# Patient Record
Sex: Male | Born: 1945 | Race: White | Hispanic: No | Marital: Married | Smoking: Never smoker
Health system: Southern US, Community
[De-identification: ages and names within clinical notes are randomized; demographics above are authoritative.]

## PROBLEM LIST (undated history)

## (undated) DIAGNOSIS — F329 Major depressive disorder, single episode, unspecified: Secondary | ICD-10-CM

## (undated) DIAGNOSIS — R0600 Dyspnea, unspecified: Secondary | ICD-10-CM

## (undated) DIAGNOSIS — F32A Depression, unspecified: Secondary | ICD-10-CM

## (undated) DIAGNOSIS — E785 Hyperlipidemia, unspecified: Secondary | ICD-10-CM

## (undated) DIAGNOSIS — M48061 Spinal stenosis, lumbar region without neurogenic claudication: Secondary | ICD-10-CM

## (undated) DIAGNOSIS — N400 Enlarged prostate without lower urinary tract symptoms: Secondary | ICD-10-CM

## (undated) DIAGNOSIS — M1990 Unspecified osteoarthritis, unspecified site: Secondary | ICD-10-CM

## (undated) DIAGNOSIS — S3993XA Unspecified injury of pelvis, initial encounter: Secondary | ICD-10-CM

## (undated) HISTORY — PX: TONSILLECTOMY: SUR1361

## (undated) HISTORY — PX: OTHER SURGICAL HISTORY: SHX169

---

## 2001-04-22 ENCOUNTER — Encounter: Payer: Self-pay | Admitting: Family Medicine

## 2001-04-22 ENCOUNTER — Ambulatory Visit (HOSPITAL_COMMUNITY): Admission: RE | Admit: 2001-04-22 | Discharge: 2001-04-22 | Payer: Self-pay | Admitting: Family Medicine

## 2001-05-06 ENCOUNTER — Encounter (INDEPENDENT_AMBULATORY_CARE_PROVIDER_SITE_OTHER): Payer: Self-pay | Admitting: Specialist

## 2001-05-06 ENCOUNTER — Ambulatory Visit (HOSPITAL_BASED_OUTPATIENT_CLINIC_OR_DEPARTMENT_OTHER): Admission: RE | Admit: 2001-05-06 | Discharge: 2001-05-06 | Payer: Self-pay | Admitting: Plastic Surgery

## 2001-07-15 ENCOUNTER — Ambulatory Visit (HOSPITAL_COMMUNITY): Admission: RE | Admit: 2001-07-15 | Discharge: 2001-07-15 | Payer: Self-pay | Admitting: Gastroenterology

## 2001-07-15 ENCOUNTER — Encounter (INDEPENDENT_AMBULATORY_CARE_PROVIDER_SITE_OTHER): Payer: Self-pay

## 2001-12-26 ENCOUNTER — Encounter: Admission: RE | Admit: 2001-12-26 | Discharge: 2001-12-26 | Payer: Self-pay | Admitting: *Deleted

## 2009-09-21 ENCOUNTER — Encounter: Admission: RE | Admit: 2009-09-21 | Discharge: 2009-09-21 | Payer: Self-pay | Admitting: General Surgery

## 2009-11-09 ENCOUNTER — Inpatient Hospital Stay (HOSPITAL_COMMUNITY): Admission: RE | Admit: 2009-11-09 | Discharge: 2009-11-13 | Payer: Self-pay | Admitting: Orthopedic Surgery

## 2010-11-21 LAB — CBC
HCT: 33 % — ABNORMAL LOW (ref 39.0–52.0)
HCT: 34 % — ABNORMAL LOW (ref 39.0–52.0)
HCT: 38.1 % — ABNORMAL LOW (ref 39.0–52.0)
Hemoglobin: 11.3 g/dL — ABNORMAL LOW (ref 13.0–17.0)
MCHC: 32.9 g/dL (ref 30.0–36.0)
MCV: 91.2 fL (ref 78.0–100.0)
MCV: 91.7 fL (ref 78.0–100.0)
MCV: 92.4 fL (ref 78.0–100.0)
Platelets: 159 10*3/uL (ref 150–400)
Platelets: 189 10*3/uL (ref 150–400)
RBC: 3.65 MIL/uL — ABNORMAL LOW (ref 4.22–5.81)
RBC: 3.73 MIL/uL — ABNORMAL LOW (ref 4.22–5.81)
RBC: 4.15 MIL/uL — ABNORMAL LOW (ref 4.22–5.81)
RBC: 4.21 MIL/uL — ABNORMAL LOW (ref 4.22–5.81)
WBC: 10.3 10*3/uL (ref 4.0–10.5)
WBC: 10.6 10*3/uL — ABNORMAL HIGH (ref 4.0–10.5)

## 2010-11-21 LAB — PROTIME-INR
INR: 1.07 (ref 0.00–1.49)
INR: 1.08 (ref 0.00–1.49)
INR: 2.16 — ABNORMAL HIGH (ref 0.00–1.49)
Prothrombin Time: 13.8 seconds (ref 11.6–15.2)
Prothrombin Time: 13.9 seconds (ref 11.6–15.2)
Prothrombin Time: 22.2 seconds — ABNORMAL HIGH (ref 11.6–15.2)

## 2010-11-21 LAB — BASIC METABOLIC PANEL
BUN: 9 mg/dL (ref 6–23)
CO2: 29 mEq/L (ref 19–32)
Calcium: 8.3 mg/dL — ABNORMAL LOW (ref 8.4–10.5)
Calcium: 8.7 mg/dL (ref 8.4–10.5)
Chloride: 100 mEq/L (ref 96–112)
GFR calc Af Amer: >60 mL/min (ref >60)
GFR calc Af Amer: >60 mL/min (ref >60)
GFR calc Af Amer: >60 mL/min (ref >60)
GFR calc non Af Amer: >60 mL/min (ref >60)
GFR calc non Af Amer: >60 mL/min (ref >60)
GFR calc non Af Amer: >60 mL/min (ref >60)
Glucose, Bld: 111 mg/dL — ABNORMAL HIGH (ref 70–99)
Potassium: 4 mEq/L (ref 3.5–5.1)
Potassium: 4.3 mEq/L (ref 3.5–5.1)
Potassium: 4.6 mEq/L (ref 3.5–5.1)
Sodium: 134 mEq/L — ABNORMAL LOW (ref 135–145)
Sodium: 134 mEq/L — ABNORMAL LOW (ref 135–145)
Sodium: 137 mEq/L (ref 135–145)

## 2010-11-21 LAB — COMPREHENSIVE METABOLIC PANEL
AST: 29 U/L (ref 0–37)
BUN: 13 mg/dL (ref 6–23)
CO2: 28 mEq/L (ref 19–32)
Calcium: 8.3 mg/dL — ABNORMAL LOW (ref 8.4–10.5)
Chloride: 107 mEq/L (ref 96–112)
Creatinine, Ser: 0.75 mg/dL (ref 0.4–1.5)
GFR calc Af Amer: >60 mL/min (ref >60)
GFR calc non Af Amer: >60 mL/min (ref >60)
Total Bilirubin: 1.2 mg/dL (ref 0.3–1.2)

## 2010-11-21 LAB — TYPE AND SCREEN
ABO/RH(D): B POS
Antibody Screen: NEGATIVE

## 2010-11-21 LAB — ABO/RH: ABO/RH(D): B POS

## 2011-01-13 NOTE — Op Note (Signed)
[place]. [facility]  Patient:    [name], [name] Visit Number: [id number] MRN: [id number]          Service Type: DSU Location: [facility] Attending Physician:  Loura Halt Ii Dictated by:   Alfredia Ferguson, M.D. Proc. Date: 05/06/01 Admit Date:  05/06/2001   CC:         Hope M. Danella Deis, M.D.   Operative Report  PREOPERATIVE DIAGNOSES: 1. Biopsy-proven atypical pigmented nevus, left mid-forehead 5.0 mm. 2. A 6.0 mm raised skin lesion, right postauricular area near the root    of the helix.  POSTOPERATIVE DIAGNOSES: 1. Biopsy-proven atypical pigmented nevus, left mid-forehead 5.0 mm. 2. A 6.0 mm raised skin lesion, right postauricular area near the root    of the helix.  OPERATION: 1. Excision of previous biopsy site, left mid-forehead. 2. Excision of 6.0 mm raised skin lesion, right postauricular area.  SURGEON:  Alfredia Ferguson, M.D.  ANESTHESIA:  2% Xylocaine, with 1:100,000 epinephrine.  INDICATIONS:  This is a 65 year old gentleman who had a biopsy of a pigmented nevus in his left forehead.  The lesion returned atypical __________. Margins were positive.  Recommendation for further excision of the biopsy site has been made.  There is no residual pigmentation noted on todays examination.  The patient also points out about a 6.0 mm raised cobblestone textured skin lesion near the root of his right helix.  He wishes to have that excised.  The patient understands he is trading what he has for a permanent potentially unsightly scarring.  In spite of that he wishes to proceed with the surgery.  DESCRIPTION OF PROCEDURE:  The skin marks were placed in an elliptical fashion around both of the two lesions, and then local anesthesia was infiltrated. After waiting for approximately 10 minutes, the area behind his right ear was prepped and draped in a sterile fashion.  An elliptical excision of the lesion down to the level of the subcutaneous  tissue was carried out.  Hemostasis was accomplished using electrocautery.  The wound was closed using interrupted #5-0 nylon sutures.  The patients head was then turned to the midline, and the forehead was prepped and draped in a sterile fashion.  An elliptical excision of the previous biopsy scar was carried out, down to the level of the subcutaneous tissue.  The lesion was passed on for pathology.  The wound edges were undermined for a distance of 2.0 to 3.0 mm in all directions.  The wound was closed by approximating the dermis using interrupted #5-0 Vicryl suture. The skin was united using a running #5-0 nylon suture.  A light dressing was applied to the forehead.  The patient tolerated the procedure well with minimal blood loss.  He was discharged to home in satisfactory condition. Dictated by:   Alfredia Ferguson, M.D. Attending Physician:  Loura Halt Ii DD:  05/06/01 TD:  05/06/01 Job: [id number]

## 2011-01-13 NOTE — Procedures (Signed)
[facility]  Patient:    [name], [name] Visit Number: [id number] MRN: [id number]          Service Type: Attending:  Verlin Grills, M.D. Dictated by:   Verlin Grills, M.D. Proc. Date: 07/15/01   CC:         Amada Jupiter T. Dreiling, M.D.   Procedure Report  PROCEDURE:  Colonoscopy with polypectomy.  REFERRING PHYSICIAN:  Amada Jupiter T. Dreiling, M.D.  PROCEDURE INDICATION:  Mr. [name], (date of birth 30-Dec-1945), is a 65 year old male.  Mr. [name] underwent a colonoscopy five yeras ago and a small neoplastic polyp was removed from his colon.  He is scheduled for repeat surveillance colonoscopy with polypectomy to prevent colon cancer.  ENDOSCOPIST:  Verlin Grills, M.D.  PREMEDICATION:  Versed 5 mg, Demerol 25 mg.  ENDOSCOPE:  Olympus pediatric colonoscope.  DESCRIPTION OF PROCEDURE:  After obtaining informed consent, and discussing the risks of colonoscopy with polypectomy with Mr. [name], including intestinal bleeding and intestinal perforation, Mr. [name] was placed in the left lateral decubitus position.  I administered intravenous Versed and intravenous Demerol to achieve conscious sedation for the procedure.  The patients blood pressure, oxygen saturation, and cardiac rhythm were monitored throughout the procedure and documented in the medical record.  Anal inspection was normal.  Digital rectal exam revealed a slightly enlarged but nonnodular prostate.  The Olympus pediatric video colonoscope was introduced into the rectum and easily advanced to the cecum.  Colonic preparation for the exam today was excellent.  RECTUM:  Normal.  SIGMOID COLON AND DESCENDING COLON:  From the distal sigmoid colon, a 2 mm sessile polyp was removed with the hot biopsy forceps and submitted for pathological interpretation.  SPLENIC FLEXURE:  Normal.  TRANSVERSE COLON:  Normal.  HEPATIC FLEXURE:  Normal.  ASCENDING COLON:   Normal.  CECUM AND ILEOCECAL VALVE:  Normal.  ASSESSMENT:  From the distal sigmoid colon, a 2 mm sessile polyp was removed with the hot biopsy forceps.  Otherwise normal proctocolonoscopy to the cecum.  RECOMMENDATIONS:  Repeat colonoscopy in approximately five years. Dictated by:   Verlin Grills, M.D. Attending:  Verlin Grills, M.D. DD:  07/15/01 TD:  07/15/01 Job: [id number]

## 2011-03-06 ENCOUNTER — Ambulatory Visit (HOSPITAL_BASED_OUTPATIENT_CLINIC_OR_DEPARTMENT_OTHER)
Admission: RE | Admit: 2011-03-06 | Discharge: 2011-03-07 | Disposition: A | Discharge status: Home / Self Care | Payer: PRIVATE HEALTH INSURANCE | Source: Ambulatory Visit | Attending: Orthopedic Surgery | Admitting: Orthopedic Surgery

## 2011-03-06 DIAGNOSIS — Z01810 Encounter for preprocedural cardiovascular examination: Secondary | ICD-10-CM | POA: Insufficient documentation

## 2011-03-06 DIAGNOSIS — M719 Bursopathy, unspecified: Secondary | ICD-10-CM | POA: Insufficient documentation

## 2011-03-06 DIAGNOSIS — Z01812 Encounter for preprocedural laboratory examination: Secondary | ICD-10-CM | POA: Insufficient documentation

## 2011-03-06 DIAGNOSIS — M67919 Unspecified disorder of synovium and tendon, unspecified shoulder: Secondary | ICD-10-CM | POA: Insufficient documentation

## 2011-03-06 DIAGNOSIS — I10 Essential (primary) hypertension: Secondary | ICD-10-CM | POA: Insufficient documentation

## 2011-03-06 DIAGNOSIS — M66329 Spontaneous rupture of flexor tendons, unspecified upper arm: Secondary | ICD-10-CM | POA: Insufficient documentation

## 2011-03-06 DIAGNOSIS — M19019 Primary osteoarthritis, unspecified shoulder: Secondary | ICD-10-CM | POA: Insufficient documentation

## 2011-03-06 DIAGNOSIS — M24119 Other articular cartilage disorders, unspecified shoulder: Secondary | ICD-10-CM | POA: Insufficient documentation

## 2011-03-06 DIAGNOSIS — M25519 Pain in unspecified shoulder: Secondary | ICD-10-CM | POA: Insufficient documentation

## 2011-03-06 LAB — POCT HEMOGLOBIN-HEMACUE: Hemoglobin: 15.9 g/dL (ref 13.0–17.0)

## 2011-03-09 NOTE — Op Note (Signed)
NAMEMAC, DOWDELL NO.:  [id number]  MEDICAL RECORD NO.:  [id number]  LOCATION:                                 FACILITY:  PHYSICIAN:  Marlowe Kays, M.D.       DATE OF BIRTH:  DATE OF PROCEDURE:  03/06/2011 DATE OF DISCHARGE:                              OPERATIVE REPORT   PREOPERATIVE DIAGNOSES: 1. Labral tear. 2. Partial tear of long head biceps tendon. 3. 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  POSTOP DIAGNOSES: 1. Labral and biceps tendon tear. 2. 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 3. Acromioclavicular joint arthritis, right shoulder.  OPERATION: 1. Right shoulder arthroscopy with debridement of labrum and remnant     of long head biceps tendon. 2. Arthroscopic subacromial decompression. 3. Open distal clavicle resection. 4. Open repair of complete retracted rotator cuff tear.  SURGEON:  Marlowe Kays, M.D.  ASSISTANTDruscilla Brownie. Idolina Primer, PA-C  ANESTHESIA:  General.  PLAN/JUSTIFICATION FOR PROCEDURE:  Because of the right shoulder pain back on September 03, 2009 or year-and-a-half ago, he had right shoulder MRI demonstrating the preoperative diagnoses.  He waited until the pain became enough of a problem to have the above-mentioned surgery performed.  PROCEDURE:  Prophylactic antibiotics, interscalene block by anesthesiologist, satisfied general anesthesia, beach-chair position on the sliding frame, right shoulder girdle was prepped with DuraPrep, draped in sterile field.  Time-out performed.  Anatomy of the shoulder joint was marked out.  Through a posterior soft portal, I dramatically entered the keel glenohumeral joint.  There was a large amount of synovitis present.  A long head of the biceps tendon was not clearly definable.  He did have degenerative type tearing of the labrum to either side of the  biceps anchor.  I advanced the scope between what appeared to be the remnant of biceps tendon and the subscapularis, and using switching stick made an anterior incision over which I placed a metal cannula, followed by 4.2 shaver entering into the joint, cleared up the synovitis to shave the labrum and also the biceps tendon.  I then redirected the scope into the subacromial space, as expected there was good bit of bursitis present.  Through a lateral portal, I entered the 4.2 shaver and cleaned up most of bursitis and then began the subacromial decompression.  I worked first with a Forensic scientist removing soft tissue from the undersurface of the acromion and followed this with a 4-mm oval bur and worked back-and-forth between these 2 instruments.  When we had a nice decompression, I then probed the rotator cuff and found a large retracted full-thickness rotator cuff tear.  Accordingly, I had already planned to open up the distal clavicle and performed distal clavicle excision and I simply extended the incision more lateralward.  At the [facility] joint with subperiosteal dissection, I identified the [facility] joint and measured a centimeter and a half proximal ward undermining the clavicle.  At this point, I used a microsaw to amputate the clavicle and then removed the lateral fragment with towel clip and cautery technique.  I removed some small remaining bone spicules on the parent clavicle which I covered with bone wax.  I then extended the incision distal ward and with subperiosteal dissection, I identified the anterior acromion and confirmed the large rotator cuff tear.  I took pictures of it.  I had removed small amount of additional acromion for exposure, although the decompression from the arthroscopy portion was satisfactory.  I elected to use 2 striker four strand rotator cuff anchors, weaving them throughout the length of the tear.  He had a substantial remnant of lateral  rotator cuff remaining. With each needle, I wove the suture from underneath up through the rotator cuff and back down through the lateral tissue.  I tied them all as a unit and then at some additional sutures individually at the suture line.  This seemed to give a nice stable repair.  Final pictures were taken.  Wound was irrigated with sterile saline.  Gelfoam was placed in the distal clavicle resection site.  The fascia over the residual acromion and clavicle was closed with interrupted #1 Vicryl, subcutaneous tissue with 2-0 Vicryl.  Steri-Strips on the skin.  Three portals were closed with 4-0 nylon, Betadine and Adaptic dry.  Dry sterile dressing were applied followed by shoulder immobilizer.  He tolerated the procedure well, was taken to recovery room in satisfactory condition with no known complications.          ______________________________ Marlowe Kays, M.D.     JA/MEDQ  D:  03/06/2011  T:  03/06/2011  Job:  [id number]  Electronically Signed by Marlowe Kays M.D. on 03/09/2011 12:19:21 PM

## 2011-05-19 ENCOUNTER — Other Ambulatory Visit: Payer: Self-pay | Admitting: Orthopedic Surgery

## 2011-05-19 DIAGNOSIS — M25519 Pain in unspecified shoulder: Secondary | ICD-10-CM

## 2011-06-07 ENCOUNTER — Ambulatory Visit
Admission: RE | Admit: 2011-06-07 | Discharge: 2011-06-07 | Disposition: A | Discharge status: Home / Self Care | Payer: PRIVATE HEALTH INSURANCE | Source: Ambulatory Visit | Attending: Orthopedic Surgery | Admitting: Orthopedic Surgery

## 2011-06-07 DIAGNOSIS — M25519 Pain in unspecified shoulder: Secondary | ICD-10-CM

## 2011-06-07 MED ORDER — IOHEXOL 180 MG/ML  SOLN: 5.0000 mL | Freq: Once | INTRAMUSCULAR | Status: AC | PRN

## 2011-06-08 ENCOUNTER — Emergency Department (HOSPITAL_COMMUNITY): Payer: Medicare Other

## 2011-06-08 ENCOUNTER — Emergency Department (HOSPITAL_COMMUNITY)
Admission: EM | Admit: 2011-06-08 | Discharge: 2011-06-09 | Disposition: A | Discharge status: Other Acute Inpatient Hospital | Payer: Medicare Other | Source: Home / Self Care | Attending: Emergency Medicine | Admitting: Emergency Medicine

## 2011-06-08 DIAGNOSIS — W309XXA Contact with unspecified agricultural machinery, initial encounter: Secondary | ICD-10-CM | POA: Insufficient documentation

## 2011-06-08 DIAGNOSIS — R109 Unspecified abdominal pain: Secondary | ICD-10-CM | POA: Insufficient documentation

## 2011-06-08 DIAGNOSIS — M25559 Pain in unspecified hip: Secondary | ICD-10-CM | POA: Insufficient documentation

## 2011-06-08 DIAGNOSIS — S329XXA Fracture of unspecified parts of lumbosacral spine and pelvis, initial encounter for closed fracture: Secondary | ICD-10-CM

## 2011-06-08 DIAGNOSIS — Y92009 Unspecified place in unspecified non-institutional (private) residence as the place of occurrence of the external cause: Secondary | ICD-10-CM | POA: Insufficient documentation

## 2011-06-08 DIAGNOSIS — Z96649 Presence of unspecified artificial hip joint: Secondary | ICD-10-CM | POA: Insufficient documentation

## 2011-06-08 DIAGNOSIS — S32309A Unspecified fracture of unspecified ilium, initial encounter for closed fracture: Secondary | ICD-10-CM

## 2011-06-08 DIAGNOSIS — S32409A Unspecified fracture of unspecified acetabulum, initial encounter for closed fracture: Secondary | ICD-10-CM

## 2011-06-08 DIAGNOSIS — S7700XA Crushing injury of unspecified hip, initial encounter: Secondary | ICD-10-CM | POA: Insufficient documentation

## 2011-06-08 LAB — PROTIME-INR: INR: 1.08 (ref 0.00–1.49)

## 2011-06-08 LAB — DIFFERENTIAL
Basophils Absolute: 0 10*3/uL (ref 0.0–0.1)
Eosinophils Absolute: 0.3 10*3/uL (ref 0.0–0.7)
Lymphocytes Relative: 8 % — ABNORMAL LOW (ref 12–46)
Monocytes Absolute: 1.6 10*3/uL — ABNORMAL HIGH (ref 0.1–1.0)
Neutrophils Relative %: 85 % — ABNORMAL HIGH (ref 43–77)

## 2011-06-08 LAB — COMPREHENSIVE METABOLIC PANEL
ALT: 42 U/L (ref 0–53)
AST: 47 U/L — ABNORMAL HIGH (ref 0–37)
Albumin: 3.8 g/dL (ref 3.5–5.2)
Alkaline Phosphatase: 93 U/L (ref 39–117)
Chloride: 102 mEq/L (ref 96–112)
Creatinine, Ser: 1.05 mg/dL (ref 0.50–1.35)
Potassium: 3.8 mEq/L (ref 3.5–5.1)
Sodium: 139 mEq/L (ref 135–145)
Total Bilirubin: 0.6 mg/dL (ref 0.3–1.2)

## 2011-06-08 LAB — CBC
Platelets: 228 10*3/uL (ref 150–400)
RDW: 13.2 % (ref 11.5–15.5)
WBC: 26.2 10*3/uL — ABNORMAL HIGH (ref 4.0–10.5)

## 2011-06-08 LAB — SAMPLE TO BLOOD BANK

## 2011-06-08 MED ORDER — IOHEXOL 300 MG/ML  SOLN
100.0000 mL | Freq: Once | INTRAMUSCULAR | Status: AC | PRN
  Administered 2011-06-08: 100 mL via INTRAVENOUS

## 2011-06-09 ENCOUNTER — Inpatient Hospital Stay (HOSPITAL_COMMUNITY)
Admission: EM | Admit: 2011-06-09 | Discharge: 2011-06-19 | DRG: 913 | Disposition: A | Discharge status: Skilled Nursing Facility | Payer: Medicare Other | Source: Other Acute Inpatient Hospital | Attending: General Surgery | Admitting: General Surgery

## 2011-06-09 DIAGNOSIS — K560 Paralytic ileus: Secondary | ICD-10-CM | POA: Diagnosis not present

## 2011-06-09 DIAGNOSIS — F3289 Other specified depressive episodes: Secondary | ICD-10-CM | POA: Diagnosis present

## 2011-06-09 DIAGNOSIS — S32509A Unspecified fracture of unspecified pubis, initial encounter for closed fracture: Secondary | ICD-10-CM | POA: Diagnosis present

## 2011-06-09 DIAGNOSIS — W230XXA Caught, crushed, jammed, or pinched between moving objects, initial encounter: Secondary | ICD-10-CM | POA: Diagnosis present

## 2011-06-09 DIAGNOSIS — S32409A Unspecified fracture of unspecified acetabulum, initial encounter for closed fracture: Secondary | ICD-10-CM | POA: Diagnosis present

## 2011-06-09 DIAGNOSIS — F329 Major depressive disorder, single episode, unspecified: Secondary | ICD-10-CM | POA: Diagnosis present

## 2011-06-09 DIAGNOSIS — S3210XA Unspecified fracture of sacrum, initial encounter for closed fracture: Secondary | ICD-10-CM | POA: Diagnosis present

## 2011-06-09 DIAGNOSIS — D62 Acute posthemorrhagic anemia: Secondary | ICD-10-CM

## 2011-06-09 DIAGNOSIS — IMO0001 Reserved for inherently not codable concepts without codable children: Principal | ICD-10-CM | POA: Diagnosis present

## 2011-06-09 DIAGNOSIS — S32609A Unspecified fracture of unspecified ischium, initial encounter for closed fracture: Secondary | ICD-10-CM | POA: Diagnosis present

## 2011-06-09 DIAGNOSIS — IMO0002 Reserved for concepts with insufficient information to code with codable children: Secondary | ICD-10-CM | POA: Diagnosis present

## 2011-06-09 DIAGNOSIS — R339 Retention of urine, unspecified: Secondary | ICD-10-CM | POA: Diagnosis present

## 2011-06-09 DIAGNOSIS — S9000XA Contusion of unspecified ankle, initial encounter: Secondary | ICD-10-CM | POA: Diagnosis present

## 2011-06-09 LAB — CBC
Hemoglobin: 12.3 g/dL — ABNORMAL LOW (ref 13.0–17.0)
RBC: 3.97 MIL/uL — ABNORMAL LOW (ref 4.22–5.81)
WBC: 16.9 10*3/uL — ABNORMAL HIGH (ref 4.0–10.5)

## 2011-06-09 LAB — BASIC METABOLIC PANEL
CO2: 25 mEq/L (ref 19–32)
Chloride: 108 mEq/L (ref 96–112)
Glucose, Bld: 143 mg/dL — ABNORMAL HIGH (ref 70–99)
Potassium: 4.4 mEq/L (ref 3.5–5.1)
Sodium: 141 mEq/L (ref 135–145)

## 2011-06-10 ENCOUNTER — Inpatient Hospital Stay (HOSPITAL_COMMUNITY): Payer: Medicare Other

## 2011-06-10 LAB — CBC
HCT: 33.6 % — ABNORMAL LOW (ref 39.0–52.0)
MCV: 89.6 fL (ref 78.0–100.0)
Platelets: 129 10*3/uL — ABNORMAL LOW (ref 150–400)
RBC: 3.75 MIL/uL — ABNORMAL LOW (ref 4.22–5.81)
WBC: 11.9 10*3/uL — ABNORMAL HIGH (ref 4.0–10.5)

## 2011-06-11 ENCOUNTER — Inpatient Hospital Stay (HOSPITAL_COMMUNITY): Payer: Medicare Other

## 2011-06-11 DIAGNOSIS — K560 Paralytic ileus: Secondary | ICD-10-CM

## 2011-06-11 LAB — CBC
HCT: 30.6 % — ABNORMAL LOW (ref 39.0–52.0)
Hemoglobin: 10.5 g/dL — ABNORMAL LOW (ref 13.0–17.0)
MCH: 30.9 pg (ref 26.0–34.0)
MCHC: 34.3 g/dL (ref 30.0–36.0)
RBC: 3.4 MIL/uL — ABNORMAL LOW (ref 4.22–5.81)

## 2011-06-11 LAB — BASIC METABOLIC PANEL
BUN: 11 mg/dL (ref 6–23)
CO2: 29 mEq/L (ref 19–32)
GFR calc non Af Amer: >90 mL/min (ref >90)
Glucose, Bld: 104 mg/dL — ABNORMAL HIGH (ref 70–99)
Potassium: 3.8 mEq/L (ref 3.5–5.1)

## 2011-06-12 LAB — BASIC METABOLIC PANEL
BUN: 9 mg/dL (ref 6–23)
CO2: 31 mEq/L (ref 19–32)
Calcium: 8.6 mg/dL (ref 8.4–10.5)
Creatinine, Ser: 0.67 mg/dL (ref 0.50–1.35)
GFR calc non Af Amer: >90 mL/min (ref >90)
Glucose, Bld: 99 mg/dL (ref 70–99)
Sodium: 134 mEq/L — ABNORMAL LOW (ref 135–145)

## 2011-06-12 LAB — CBC
Hemoglobin: 9.8 g/dL — ABNORMAL LOW (ref 13.0–17.0)
MCH: 30.4 pg (ref 26.0–34.0)
MCHC: 34.1 g/dL (ref 30.0–36.0)
MCV: 89.1 fL (ref 78.0–100.0)

## 2011-06-12 LAB — GLUCOSE, CAPILLARY: Glucose-Capillary: 99 mg/dL (ref 70–99)

## 2011-06-13 ENCOUNTER — Inpatient Hospital Stay (HOSPITAL_COMMUNITY): Payer: Medicare Other

## 2011-06-13 LAB — CBC
HCT: 28 % — ABNORMAL LOW (ref 39.0–52.0)
Hemoglobin: 9.8 g/dL — ABNORMAL LOW (ref 13.0–17.0)
MCH: 31.2 pg (ref 26.0–34.0)
MCHC: 35 g/dL (ref 30.0–36.0)
MCV: 89.2 fL (ref 78.0–100.0)
RDW: 13.3 % (ref 11.5–15.5)

## 2011-06-13 LAB — URINALYSIS, ROUTINE W REFLEX MICROSCOPIC
Bilirubin Urine: NEGATIVE
Glucose, UA: NEGATIVE mg/dL
Ketones, ur: 15 mg/dL — AB
Nitrite: NEGATIVE
Specific Gravity, Urine: 1.011 (ref 1.005–1.030)
pH: 6 (ref 5.0–8.0)

## 2011-06-13 LAB — URINE MICROSCOPIC-ADD ON

## 2011-06-14 ENCOUNTER — Inpatient Hospital Stay (HOSPITAL_COMMUNITY): Payer: Medicare Other

## 2011-06-14 LAB — BASIC METABOLIC PANEL
CO2: 32 mEq/L (ref 19–32)
Calcium: 9 mg/dL (ref 8.4–10.5)
GFR calc non Af Amer: >90 mL/min (ref >90)
Glucose, Bld: 114 mg/dL — ABNORMAL HIGH (ref 70–99)
Potassium: 3.5 mEq/L (ref 3.5–5.1)
Sodium: 136 mEq/L (ref 135–145)

## 2011-06-14 LAB — URINE CULTURE: Culture: NO GROWTH

## 2011-06-14 LAB — GLUCOSE, CAPILLARY
Glucose-Capillary: 107 mg/dL — ABNORMAL HIGH (ref 70–99)
Glucose-Capillary: 127 mg/dL — ABNORMAL HIGH (ref 70–99)

## 2011-06-14 MED ORDER — IOHEXOL 300 MG/ML  SOLN
80.0000 mL | Freq: Once | INTRAMUSCULAR | Status: AC | PRN
  Administered 2011-06-14: 80 mL via INTRAVENOUS
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
## 2011-06-15 LAB — COMPREHENSIVE METABOLIC PANEL
ALT: 44 U/L (ref 0–53)
AST: 42 U/L — ABNORMAL HIGH (ref 0–37)
CO2: 30 mEq/L (ref 19–32)
Chloride: 99 mEq/L (ref 96–112)
Creatinine, Ser: 0.65 mg/dL (ref 0.50–1.35)
GFR calc non Af Amer: >90 mL/min (ref >90)
Total Bilirubin: 1.3 mg/dL — ABNORMAL HIGH (ref 0.3–1.2)

## 2011-06-15 LAB — BASIC METABOLIC PANEL
BUN: 9 mg/dL (ref 6–23)
Creatinine, Ser: 0.65 mg/dL (ref 0.50–1.35)
GFR calc Af Amer: >90 mL/min (ref >90)
GFR calc non Af Amer: >90 mL/min (ref >90)
Glucose, Bld: 123 mg/dL — ABNORMAL HIGH (ref 70–99)

## 2011-06-15 LAB — CBC
HCT: 29.2 % — ABNORMAL LOW (ref 39.0–52.0)
Hemoglobin: 10.1 g/dL — ABNORMAL LOW (ref 13.0–17.0)
MCH: 30.4 pg (ref 26.0–34.0)
MCV: 89.6 fL (ref 78.0–100.0)
MCV: 88.7 fL (ref 78.0–100.0)
Platelets: 216 10*3/uL (ref 150–400)
Platelets: 227 10*3/uL (ref 150–400)
RBC: 3.28 MIL/uL — ABNORMAL LOW (ref 4.22–5.81)
RDW: 13.7 % (ref 11.5–15.5)

## 2011-06-15 LAB — GLUCOSE, CAPILLARY

## 2011-06-15 LAB — PREALBUMIN: Prealbumin: 12.8 mg/dL — ABNORMAL LOW (ref 17.0–34.0)

## 2011-06-16 LAB — GLUCOSE, CAPILLARY: Glucose-Capillary: 106 mg/dL — ABNORMAL HIGH (ref 70–99)

## 2011-06-16 LAB — BASIC METABOLIC PANEL
CO2: 29 mEq/L (ref 19–32)
Chloride: 99 mEq/L (ref 96–112)
Sodium: 135 mEq/L (ref 135–145)

## 2011-06-16 LAB — CHOLESTEROL, TOTAL: Cholesterol: 172 mg/dL (ref 0–200)

## 2011-06-16 LAB — MAGNESIUM: Magnesium: 2.2 mg/dL (ref 1.5–2.5)

## 2011-06-19 ENCOUNTER — Inpatient Hospital Stay
Admission: RE | Admit: 2011-06-19 | Discharge: 2011-07-07 | Disposition: A | Discharge status: Home / Self Care | Payer: Medicare Other | Source: Ambulatory Visit | Attending: Internal Medicine | Admitting: Internal Medicine

## 2011-06-19 LAB — DIFFERENTIAL
Eosinophils Relative: 5 % (ref 0–5)
Lymphocytes Relative: 20 % (ref 12–46)
Lymphs Abs: 2.5 10*3/uL (ref 0.7–4.0)
Monocytes Absolute: 1.3 10*3/uL — ABNORMAL HIGH (ref 0.1–1.0)
Monocytes Relative: 11 % (ref 3–12)

## 2011-06-19 LAB — COMPREHENSIVE METABOLIC PANEL
Alkaline Phosphatase: 153 U/L — ABNORMAL HIGH (ref 39–117)
BUN: 18 mg/dL (ref 6–23)
Calcium: 9.4 mg/dL (ref 8.4–10.5)
Creatinine, Ser: 0.65 mg/dL (ref 0.50–1.35)
GFR calc Af Amer: >90 mL/min (ref >90)
Glucose, Bld: 115 mg/dL — ABNORMAL HIGH (ref 70–99)
Potassium: 3.9 mEq/L (ref 3.5–5.1)
Total Protein: 6.7 g/dL (ref 6.0–8.3)

## 2011-06-19 LAB — CBC
HCT: 32.7 % — ABNORMAL LOW (ref 39.0–52.0)
MCH: 31 pg (ref 26.0–34.0)
MCHC: 33.9 g/dL (ref 30.0–36.0)
MCV: 91.3 fL (ref 78.0–100.0)
RDW: 14.3 % (ref 11.5–15.5)

## 2011-06-19 LAB — MAGNESIUM: Magnesium: 2.3 mg/dL (ref 1.5–2.5)
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
## 2012-06-05 ENCOUNTER — Other Ambulatory Visit: Payer: Self-pay | Admitting: Gastroenterology

## 2012-07-24 ENCOUNTER — Ambulatory Visit (INDEPENDENT_AMBULATORY_CARE_PROVIDER_SITE_OTHER): Payer: Medicare Other | Admitting: Family Medicine

## 2012-07-24 VITALS — BP 132/84 | HR 65 | Temp 98.2°F | Resp 16 | Ht 69.0 in | Wt 221.0 lb

## 2012-07-24 DIAGNOSIS — H547 Unspecified visual loss: Secondary | ICD-10-CM

## 2012-07-24 DIAGNOSIS — J45909 Unspecified asthma, uncomplicated: Secondary | ICD-10-CM

## 2012-07-24 DIAGNOSIS — Z0000 Encounter for general adult medical examination without abnormal findings: Secondary | ICD-10-CM

## 2012-07-24 DIAGNOSIS — H9190 Unspecified hearing loss, unspecified ear: Secondary | ICD-10-CM

## 2012-07-24 NOTE — Addendum Note (Signed)
Addended by: Maryann Alar on: 07/24/2012 05:46 PM   Modules accepted: Level of Service

## 2012-07-24 NOTE — Progress Notes (Signed)
This 66 year old gentleman comes in for a DOT physical. He has difficulty hearing out of his right ear and his vision is 20/50 in each eye. He's known for a while that his vision has not very good but hasn't seen a doctor in a while.  Objective: Mildly obese HEENT: Grossly normal including otoscopic and ophthalmologic exams, teeth are in terrible condition Neck: No bruit, no thyromegaly, supple, no adenopathy Chest: Clear Heart: Regular no murmur Abdomen: Lipoma right upper quadrant, no HSM, no masses, no tenderness or guarding Genitalia normal Extremities: Normal with exception of an abrasion on his right  Assessment has not passed ophthalmologic exam for DOT physical. Patient will bring back his exam results from Dr. Dione Booze once he's been evaluated there.

## 2012-08-22 ENCOUNTER — Telehealth: Payer: Self-pay

## 2012-08-22 NOTE — Telephone Encounter (Signed)
Patient went to eye doctor to get info to get DOT PE completed. Would like to know if we have ppw and can get dot ppw done. [phone]

## 2012-08-22 NOTE — Telephone Encounter (Signed)
Have you gotten the information from Dr Dione Booze? I have not.

## 2012-08-22 NOTE — Telephone Encounter (Signed)
Have not seen paper work?

## 2012-08-23 NOTE — Telephone Encounter (Signed)
No paperwork found from dr. Lucious Groves office yet

## 2012-08-23 NOTE — Telephone Encounter (Signed)
Left message for patient to advise I do not have his paperwork, he is instructed to fax it to me or bring it to me here.

## 2012-08-23 NOTE — Telephone Encounter (Signed)
Can you please check to see if you have any paperwork from dr groats office on this pt.

## 2012-08-26 ENCOUNTER — Telehealth: Payer: Self-pay

## 2012-08-26 NOTE — Telephone Encounter (Signed)
Pt states he is returning our call from earlier today   Best number [phone]

## 2012-08-26 NOTE — Telephone Encounter (Signed)
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 Pt stated he will be in to get it.

## 2012-08-27 NOTE — Telephone Encounter (Signed)
See notes under 08/26/12 phone message.

## 2012-09-30 IMAGING — CR DG CHEST 1V PORT
1 series · 1 of 1 positions shown · non-contrast
Comparison: Portable exam 9691 hours compared to 09/03/2006

CLINICAL DATA: PICC line placement

PORTABLE CHEST - 1 VIEW

[view not recorded]
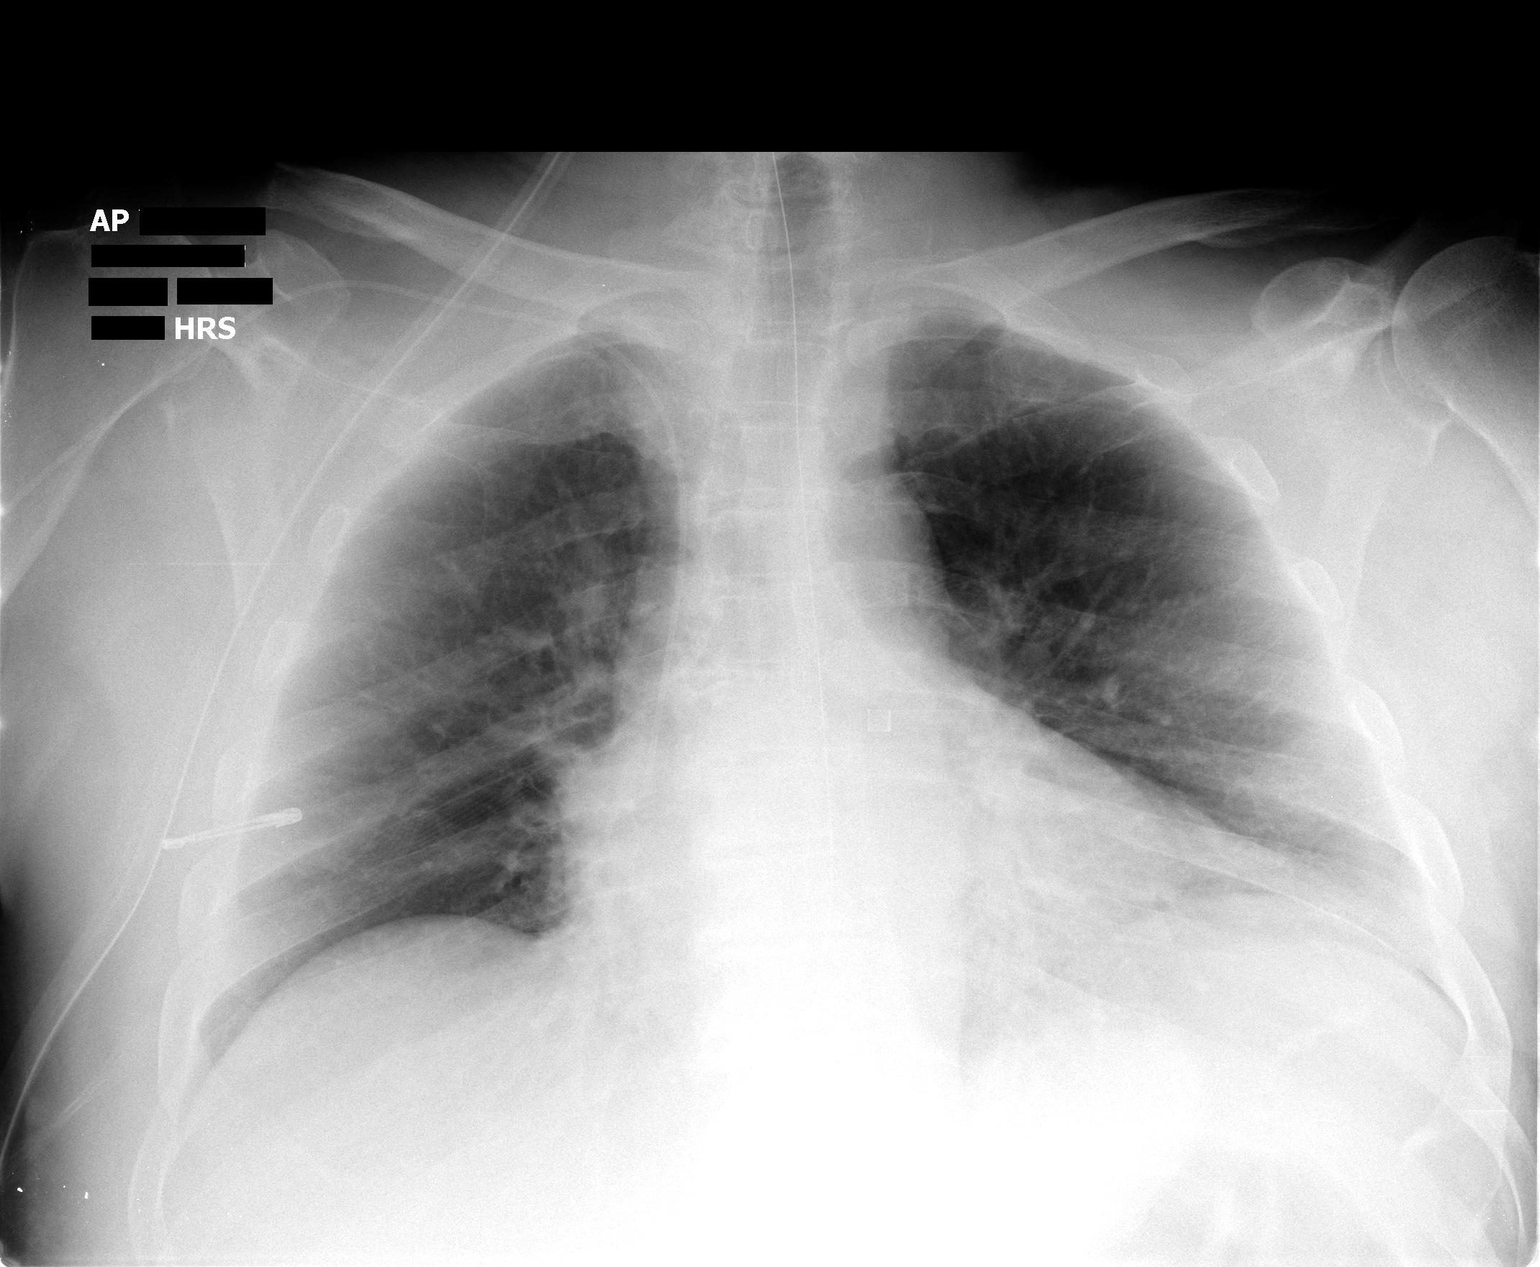

[1 of 1 positions shown; findings below may reference images not displayed]

FINDINGS: Right arm PICC line, tip at cavoatrial junction.
Nasogastric tube coiled in stomach.
Minimal enlargement of cardiac silhouette.
Slight pulmonary vascular congestion.
Minimal bronchitic changes and basilar atelectasis.
Upper lungs clear.
Bones demineralized.
IMPRESSION: Tip of right arm PICC line at cavoatrial junction.
Minimal bibasilar atelectasis and bronchitic changes.

## 2012-11-19 ENCOUNTER — Ambulatory Visit (HOSPITAL_COMMUNITY)
Admission: RE | Admit: 2012-11-19 | Discharge: 2012-11-19 | Disposition: A | Discharge status: Home / Self Care | Payer: Medicare Other | Source: Ambulatory Visit | Attending: Orthopedic Surgery | Admitting: Orthopedic Surgery

## 2012-11-19 ENCOUNTER — Other Ambulatory Visit (HOSPITAL_COMMUNITY): Payer: Self-pay | Admitting: Orthopedic Surgery

## 2012-11-19 DIAGNOSIS — M25512 Pain in left shoulder: Secondary | ICD-10-CM

## 2012-11-19 DIAGNOSIS — Z1389 Encounter for screening for other disorder: Secondary | ICD-10-CM | POA: Insufficient documentation

## 2012-12-03 ENCOUNTER — Other Ambulatory Visit: Payer: Self-pay | Admitting: Orthopedic Surgery

## 2012-12-18 ENCOUNTER — Encounter (HOSPITAL_COMMUNITY): Payer: Self-pay | Admitting: Pharmacy Technician

## 2012-12-23 ENCOUNTER — Encounter (HOSPITAL_COMMUNITY): Payer: Self-pay

## 2012-12-23 ENCOUNTER — Encounter (HOSPITAL_COMMUNITY)
Admission: RE | Admit: 2012-12-23 | Discharge: 2012-12-23 | Disposition: A | Discharge status: Home / Self Care | Payer: Medicare Other | Source: Ambulatory Visit | Attending: Orthopedic Surgery | Admitting: Orthopedic Surgery

## 2012-12-23 DIAGNOSIS — S43429A Sprain of unspecified rotator cuff capsule, initial encounter: Secondary | ICD-10-CM | POA: Insufficient documentation

## 2012-12-23 DIAGNOSIS — X58XXXA Exposure to other specified factors, initial encounter: Secondary | ICD-10-CM | POA: Insufficient documentation

## 2012-12-23 DIAGNOSIS — Z01812 Encounter for preprocedural laboratory examination: Secondary | ICD-10-CM | POA: Insufficient documentation

## 2012-12-23 HISTORY — DX: Depression, unspecified: F32.A

## 2012-12-23 HISTORY — DX: Major depressive disorder, single episode, unspecified: F32.9

## 2012-12-23 HISTORY — PX: SHOULDER ARTHROSCOPY WITH ROTATOR CUFF REPAIR AND SUBACROMIAL DECOMPRESSION: SHX5686

## 2012-12-23 HISTORY — DX: Unspecified osteoarthritis, unspecified site: M19.90

## 2012-12-23 HISTORY — PX: JOINT REPLACEMENT: SHX530

## 2012-12-23 LAB — CBC
HCT: 43.6 % (ref 39.0–52.0)
MCHC: 34.9 g/dL (ref 30.0–36.0)
RDW: 12.9 % (ref 11.5–15.5)

## 2012-12-23 LAB — SURGICAL PCR SCREEN: Staphylococcus aureus: NEGATIVE

## 2012-12-23 NOTE — Patient Instructions (Addendum)
20 [name]  12/23/2012   Your procedure is scheduled on:   01-01-2013  Report to [facility] at   0930     AM .  Call this number if you have problems the morning of surgery: [phone]  Or Presurgical Testing [phone](Gianny Killman)     Do not eat food:After Midnight.    Take these medicines the morning of surgery with A SIP OF WATER: Wellbutrin. Pravastatin.   Do not wear jewelry, make-up or nail polish.  Do not wear lotions, powders, or perfumes. You may wear deodorant.  Do not shave 12 hours prior to first CHG shower(legs and under arms).(face and neck okay.)  Do not bring valuables to the hospital.  Contacts, dentures or bridgework,body piercing,  may not be worn into surgery.  Leave suitcase in the car. After surgery it may be brought to your room.  For patients admitted to the hospital, checkout time is 11:00 AM the day of discharge.   Patients discharged the day of surgery will not be allowed to drive home. Must have responsible person with you x 24 hours once discharged.  Name and phone number of your driver: Jovontae Banko, spouse [phone] cell  Special Instructions: CHG(Chlorhedine 4%-"Hibiclens","Betasept","Aplicare") Shower Use Special Wash: see special instructions.(avoid face and genitals)   Please read over the following fact sheets that you were given: MRSA Information,.    Failure to follow these instructions may result in Cancellation of your surgery.   Patient signature_______________________________________________________

## 2013-01-01 ENCOUNTER — Observation Stay (HOSPITAL_COMMUNITY)
Admission: RE | Admit: 2013-01-01 | Discharge: 2013-01-02 | Disposition: A | Discharge status: Home / Self Care | Payer: Medicare Other | Source: Ambulatory Visit | Attending: Orthopedic Surgery | Admitting: Orthopedic Surgery

## 2013-01-01 ENCOUNTER — Encounter (HOSPITAL_COMMUNITY)
Admission: RE | Disposition: A | Discharge status: Home / Self Care | Payer: Self-pay | Source: Ambulatory Visit | Attending: Orthopedic Surgery

## 2013-01-01 ENCOUNTER — Encounter (HOSPITAL_COMMUNITY): Payer: Self-pay | Admitting: Anesthesiology

## 2013-01-01 ENCOUNTER — Ambulatory Visit (HOSPITAL_COMMUNITY): Payer: Medicare Other | Admitting: Anesthesiology

## 2013-01-01 ENCOUNTER — Encounter (HOSPITAL_COMMUNITY): Payer: Self-pay | Admitting: *Deleted

## 2013-01-01 DIAGNOSIS — X58XXXA Exposure to other specified factors, initial encounter: Secondary | ICD-10-CM | POA: Insufficient documentation

## 2013-01-01 DIAGNOSIS — Z79899 Other long term (current) drug therapy: Secondary | ICD-10-CM | POA: Insufficient documentation

## 2013-01-01 DIAGNOSIS — M25519 Pain in unspecified shoulder: Secondary | ICD-10-CM | POA: Insufficient documentation

## 2013-01-01 DIAGNOSIS — S43422S Sprain of left rotator cuff capsule, sequela: Secondary | ICD-10-CM

## 2013-01-01 DIAGNOSIS — S43429A Sprain of unspecified rotator cuff capsule, initial encounter: Principal | ICD-10-CM | POA: Insufficient documentation

## 2013-01-01 DIAGNOSIS — Z96649 Presence of unspecified artificial hip joint: Secondary | ICD-10-CM | POA: Insufficient documentation

## 2013-01-01 DIAGNOSIS — M47817 Spondylosis without myelopathy or radiculopathy, lumbosacral region: Secondary | ICD-10-CM | POA: Insufficient documentation

## 2013-01-01 HISTORY — PX: SHOULDER OPEN ROTATOR CUFF REPAIR: SHX2407

## 2013-01-01 SURGERY — REPAIR, ROTATOR CUFF, OPEN
Anesthesia: General | Site: Shoulder | Laterality: Left | Wound class: Clean

## 2013-01-01 MED ORDER — METHOCARBAMOL 100 MG/ML IJ SOLN: 500.0000 mg | Freq: Four times a day (QID) | INTRAVENOUS | Status: DC | PRN

## 2013-01-01 MED ORDER — ROPIVACAINE HCL 5 MG/ML IJ SOLN
INTRAMUSCULAR | Status: AC
  Filled 2013-01-01: qty 30

## 2013-01-01 MED ORDER — MELOXICAM 15 MG PO TABS
15.0000 mg | ORAL_TABLET | Freq: Every day | ORAL | Status: DC
  Administered 2013-01-01 – 2013-01-02 (×2): 15 mg via ORAL
  Filled 2013-01-01 (×2): qty 1

## 2013-01-01 MED ORDER — METHOCARBAMOL 500 MG PO TABS
500.0000 mg | ORAL_TABLET | Freq: Four times a day (QID) | ORAL | Status: DC | PRN
  Administered 2013-01-02: 500 mg via ORAL
  Filled 2013-01-01: qty 1

## 2013-01-01 MED ORDER — PROPOFOL 10 MG/ML IV EMUL
INTRAVENOUS | Status: DC | PRN
  Administered 2013-01-01: 175 mg via INTRAVENOUS

## 2013-01-01 MED ORDER — METOCLOPRAMIDE HCL 10 MG PO TABS: 5.0000 mg | ORAL_TABLET | Freq: Three times a day (TID) | ORAL | Status: DC | PRN

## 2013-01-01 MED ORDER — ONDANSETRON HCL 4 MG PO TABS: 4.0000 mg | ORAL_TABLET | Freq: Four times a day (QID) | ORAL | Status: DC | PRN

## 2013-01-01 MED ORDER — PHENYLEPHRINE HCL 10 MG/ML IJ SOLN
10.0000 mg | INTRAVENOUS | Status: DC | PRN
  Administered 2013-01-01: 50 ug/min via INTRAVENOUS

## 2013-01-01 MED ORDER — ONDANSETRON HCL 4 MG/2ML IJ SOLN: 4.0000 mg | Freq: Four times a day (QID) | INTRAMUSCULAR | Status: DC | PRN

## 2013-01-01 MED ORDER — SODIUM CHLORIDE 0.9 % IV SOLN
INTRAVENOUS | Status: DC
  Administered 2013-01-01: 21:00:00 via INTRAVENOUS

## 2013-01-01 MED ORDER — BUPIVACAINE-EPINEPHRINE (PF) 0.5% -1:200000 IJ SOLN
INTRAMUSCULAR | Status: AC
  Filled 2013-01-01: qty 10

## 2013-01-01 MED ORDER — CEFAZOLIN SODIUM-DEXTROSE 2-3 GM-% IV SOLR
2.0000 g | INTRAVENOUS | Status: AC
  Administered 2013-01-01: 2 g via INTRAVENOUS

## 2013-01-01 MED ORDER — HYDROMORPHONE HCL PF 1 MG/ML IJ SOLN
0.2500 mg | INTRAMUSCULAR | Status: DC | PRN
  Administered 2013-01-01: 0.5 mg via INTRAVENOUS

## 2013-01-01 MED ORDER — METOCLOPRAMIDE HCL 5 MG/ML IJ SOLN: 5.0000 mg | Freq: Three times a day (TID) | INTRAMUSCULAR | Status: DC | PRN

## 2013-01-01 MED ORDER — ACETAMINOPHEN 650 MG RE SUPP: 650.0000 mg | Freq: Four times a day (QID) | RECTAL | Status: DC | PRN

## 2013-01-01 MED ORDER — OXYCODONE-ACETAMINOPHEN 5-325 MG PO TABS: 1.0000 | ORAL_TABLET | ORAL | Status: DC | PRN

## 2013-01-01 MED ORDER — ROPIVACAINE HCL 5 MG/ML IJ SOLN
INTRAMUSCULAR | Status: DC | PRN
  Administered 2013-01-01: 30 mL

## 2013-01-01 MED ORDER — POVIDONE-IODINE 7.5 % EX SOLN: Freq: Once | CUTANEOUS | Status: DC

## 2013-01-01 MED ORDER — 0.9 % SODIUM CHLORIDE (POUR BTL) OPTIME
TOPICAL | Status: DC | PRN
  Administered 2013-01-01: 1000 mL

## 2013-01-01 MED ORDER — BUPROPION HCL ER (XL) 300 MG PO TB24
300.0000 mg | ORAL_TABLET | Freq: Every day | ORAL | Status: DC
  Administered 2013-01-02: 300 mg via ORAL
  Filled 2013-01-01 (×2): qty 1

## 2013-01-01 MED ORDER — HYDROMORPHONE HCL PF 1 MG/ML IJ SOLN
INTRAMUSCULAR | Status: AC
  Filled 2013-01-01: qty 1

## 2013-01-01 MED ORDER — NEOSTIGMINE METHYLSULFATE 1 MG/ML IJ SOLN
INTRAMUSCULAR | Status: DC | PRN
  Administered 2013-01-01: 3 mg via INTRAVENOUS

## 2013-01-01 MED ORDER — HYDROCODONE-ACETAMINOPHEN 7.5-325 MG PO TABS
1.0000 | ORAL_TABLET | ORAL | Status: DC | PRN
  Administered 2013-01-02: 2 via ORAL
  Filled 2013-01-01: qty 2

## 2013-01-01 MED ORDER — DULOXETINE HCL 60 MG PO CPEP
60.0000 mg | ORAL_CAPSULE | Freq: Every day | ORAL | Status: DC
  Administered 2013-01-01: 60 mg via ORAL
  Filled 2013-01-01 (×2): qty 1

## 2013-01-01 MED ORDER — LACTATED RINGERS IV SOLN
INTRAVENOUS | Status: DC
  Administered 2013-01-01: 1000 mL via INTRAVENOUS
  Administered 2013-01-01 (×2): via INTRAVENOUS

## 2013-01-01 MED ORDER — CEFAZOLIN SODIUM-DEXTROSE 2-3 GM-% IV SOLR
2.0000 g | Freq: Three times a day (TID) | INTRAVENOUS | Status: AC
  Administered 2013-01-01 – 2013-01-02 (×2): 2 g via INTRAVENOUS
  Filled 2013-01-01 (×2): qty 50

## 2013-01-01 MED ORDER — EPHEDRINE SULFATE 50 MG/ML IJ SOLN
INTRAMUSCULAR | Status: DC | PRN
  Administered 2013-01-01: 5 mg via INTRAVENOUS

## 2013-01-01 MED ORDER — CISATRACURIUM BESYLATE (PF) 10 MG/5ML IV SOLN
INTRAVENOUS | Status: DC | PRN
  Administered 2013-01-01: 6 mg via INTRAVENOUS

## 2013-01-01 MED ORDER — SUCCINYLCHOLINE CHLORIDE 20 MG/ML IJ SOLN
INTRAMUSCULAR | Status: DC | PRN
  Administered 2013-01-01: 120 mg via INTRAVENOUS

## 2013-01-01 MED ORDER — ONDANSETRON HCL 4 MG/2ML IJ SOLN
INTRAMUSCULAR | Status: DC | PRN
  Administered 2013-01-01 (×2): 2 mg via INTRAVENOUS

## 2013-01-01 MED ORDER — SIMVASTATIN 40 MG PO TABS
40.0000 mg | ORAL_TABLET | Freq: Every day | ORAL | Status: DC
Start: 2013-01-02 — End: 2013-01-02
  Administered 2013-01-02: 40 mg via ORAL
  Filled 2013-01-01 (×2): qty 1

## 2013-01-01 MED ORDER — FENTANYL CITRATE 0.05 MG/ML IJ SOLN
INTRAMUSCULAR | Status: DC | PRN
  Administered 2013-01-01: 50 ug via INTRAVENOUS

## 2013-01-01 MED ORDER — MORPHINE SULFATE 2 MG/ML IJ SOLN: 1.0000 mg | INTRAMUSCULAR | Status: DC | PRN

## 2013-01-01 MED ORDER — CEFAZOLIN SODIUM-DEXTROSE 2-3 GM-% IV SOLR
INTRAVENOUS | Status: AC
  Filled 2013-01-01: qty 50

## 2013-01-01 MED ORDER — GLYCOPYRROLATE 0.2 MG/ML IJ SOLN
INTRAMUSCULAR | Status: DC | PRN
  Administered 2013-01-01: .4 mg via INTRAVENOUS

## 2013-01-01 MED ORDER — LIDOCAINE HCL (CARDIAC) 20 MG/ML IV SOLN
INTRAVENOUS | Status: DC | PRN
  Administered 2013-01-01: 75 mg via INTRAVENOUS

## 2013-01-01 MED ORDER — ACETAMINOPHEN 325 MG PO TABS: 650.0000 mg | ORAL_TABLET | Freq: Four times a day (QID) | ORAL | Status: DC | PRN

## 2013-01-01 MED ORDER — MIDAZOLAM HCL 10 MG/2ML IJ SOLN
4.0000 mg | INTRAMUSCULAR | Status: DC | PRN
  Administered 2013-01-01: 3 mg via INTRAVENOUS

## 2013-01-01 MED ORDER — ACETAMINOPHEN 10 MG/ML IV SOLN
INTRAVENOUS | Status: AC
  Filled 2013-01-01: qty 100

## 2013-01-01 MED ORDER — ASPIRIN EC 81 MG PO TBEC
81.0000 mg | DELAYED_RELEASE_TABLET | Freq: Every day | ORAL | Status: DC
  Administered 2013-01-02: 81 mg via ORAL
  Filled 2013-01-01: qty 1

## 2013-01-01 MED ORDER — ACETAMINOPHEN 10 MG/ML IV SOLN
INTRAVENOUS | Status: DC | PRN
  Administered 2013-01-01: 1000 mg via INTRAVENOUS

## 2013-01-01 MED ORDER — DEXAMETHASONE SODIUM PHOSPHATE 4 MG/ML IJ SOLN
INTRAMUSCULAR | Status: DC | PRN
  Administered 2013-01-01: 10 mg via INTRAVENOUS

## 2013-01-01 MED ORDER — FENTANYL CITRATE 0.05 MG/ML IJ SOLN
50.0000 ug | Freq: Once | INTRAMUSCULAR | Status: AC
  Administered 2013-01-01: 50 ug via INTRAVENOUS

## 2013-01-01 MED ORDER — LACTATED RINGERS IV SOLN: INTRAVENOUS | Status: DC

## 2013-01-01 MED ORDER — MIDAZOLAM HCL 5 MG/5ML IJ SOLN: INTRAMUSCULAR | Status: DC | PRN

## 2013-01-01 MED ORDER — MIDAZOLAM HCL 2 MG/2ML IJ SOLN
INTRAMUSCULAR | Status: AC
  Filled 2013-01-01: qty 4

## 2013-01-01 MED ORDER — PHENOL 1.4 % MT LIQD: 1.0000 | OROMUCOSAL | Status: DC | PRN

## 2013-01-01 MED ORDER — FENTANYL CITRATE 0.05 MG/ML IJ SOLN
INTRAMUSCULAR | Status: AC
  Filled 2013-01-01: qty 2

## 2013-01-01 MED ORDER — LIDOCAINE HCL (PF) 2 % IJ SOLN
INTRAMUSCULAR | Status: DC | PRN
  Administered 2013-01-01: 75 mL

## 2013-01-01 MED ORDER — PHENYLEPHRINE HCL 10 MG/ML IJ SOLN
INTRAMUSCULAR | Status: DC | PRN
  Administered 2013-01-01: 40 ug via INTRAVENOUS

## 2013-01-01 MED ORDER — MENTHOL 3 MG MT LOZG
1.0000 | LOZENGE | OROMUCOSAL | Status: DC | PRN
  Filled 2013-01-01: qty 9

## 2013-01-01 SURGICAL SUPPLY — 37 items
ANCH SUT 2 5.5 BABSR ASCP (Orthopedic Implant) ×2 IMPLANT
ANCHOR PEEK ZIP 5.5 NDL NO2 (Orthopedic Implant) ×2 IMPLANT
BAG SPEC THK2 15X12 ZIP CLS (MISCELLANEOUS)
BAG ZIPLOCK 12X15 (MISCELLANEOUS) ×1 IMPLANT
BLADE OSCILLATING/SAGITTAL (BLADE) ×2
BLADE SW THK.38XMED LNG THN (BLADE) ×1 IMPLANT
CLEANER TIP ELECTROSURG 2X2 (MISCELLANEOUS) ×2 IMPLANT
CLOTH BEACON ORANGE TIMEOUT ST (SAFETY) ×2 IMPLANT
CONT SPECI 4OZ STER CLIK (MISCELLANEOUS) ×2 IMPLANT
COVER SURGICAL LIGHT HANDLE (MISCELLANEOUS) ×2 IMPLANT
DRAPE LG THREE QUARTER DISP (DRAPES) ×2 IMPLANT
DRAPE POUCH INSTRU U-SHP 10X18 (DRAPES) ×2 IMPLANT
DRAPE U-SHAPE 47X51 STRL (DRAPES) ×2 IMPLANT
DRSG EMULSION OIL 3X3 NADH (GAUZE/BANDAGES/DRESSINGS) ×2 IMPLANT
DURAPREP 26ML APPLICATOR (WOUND CARE) ×2 IMPLANT
ELECT REM PT RETURN 9FT ADLT (ELECTROSURGICAL) ×2
ELECTRODE REM PT RTRN 9FT ADLT (ELECTROSURGICAL) ×1 IMPLANT
FACESHIELD LNG OPTICON STERILE (SAFETY) ×4 IMPLANT
GLOVE ECLIPSE 8.0 STRL XLNG CF (GLOVE) ×3 IMPLANT
GLOVE INDICATOR 8.0 STRL GRN (GLOVE) ×5 IMPLANT
GOWN STRL REIN XL XLG (GOWN DISPOSABLE) ×4 IMPLANT
KIT BASIN OR (CUSTOM PROCEDURE TRAY) ×2 IMPLANT
MANIFOLD NEPTUNE II (INSTRUMENTS) ×2 IMPLANT
NDL MA TROC 1/2 (NEEDLE) IMPLANT
NEEDLE MA TROC 1/2 (NEEDLE) IMPLANT
NS IRRIG 1000ML POUR BTL (IV SOLUTION) ×2 IMPLANT
PACK SHOULDER CUSTOM OPM052 (CUSTOM PROCEDURE TRAY) ×2 IMPLANT
POSITIONER SURGICAL ARM (MISCELLANEOUS) ×2 IMPLANT
SLING ARM IMMOBILIZER LRG (SOFTGOODS) ×2 IMPLANT
SPONGE SURGIFOAM ABS GEL 12-7 (HEMOSTASIS) ×2 IMPLANT
STAPLER VISISTAT 35W (STAPLE) ×2 IMPLANT
SUT BONE WAX W31G (SUTURE) ×2 IMPLANT
SUT ETHIBOND NAB CT1 #1 30IN (SUTURE) ×1 IMPLANT
SUT VIC AB 1 CT1 27 (SUTURE) ×4
SUT VIC AB 1 CT1 27XBRD ANTBC (SUTURE) ×2 IMPLANT
SUT VIC AB 2-0 CT1 27 (SUTURE) ×4
SUT VIC AB 2-0 CT1 27XBRD (SUTURE) ×2 IMPLANT

## 2013-01-01 NOTE — Anesthesia Postprocedure Evaluation (Signed)
  Anesthesia Post-op Note  Patient: [name]  Procedure(s) Performed: Procedure(s) (LRB): OPEN ANTERIOR ACROMINECTOMY/ROTATOR CUFF REPAIR/DISTAL CLAVICLE RESECTION  LEFT SHOULDER (Left)  Patient Location: PACU  Anesthesia Type: General and GA combined with regional for post-op pain  Level of Consciousness: awake and alert   Airway and Oxygen Therapy: Patient Spontanous Breathing  Post-op Pain: mild  Post-op Assessment: Post-op Vital signs reviewed, Patient's Cardiovascular Status Stable, Respiratory Function Stable, Patent Airway and No signs of Nausea or vomiting  Last Vitals:  Filed Vitals:   01/01/13 1400  BP: 129/80  Pulse: 88  Temp: 36.9 C  Resp: 13    Post-op Vital Signs: stable   Complications: No apparent anesthesia complications

## 2013-01-01 NOTE — Transfer of Care (Signed)
Immediate Anesthesia Transfer of Care Note  Patient: [name]  Procedure(s) Performed: Procedure(s): OPEN ANTERIOR ACROMINECTOMY/ROTATOR CUFF REPAIR/DISTAL CLAVICLE RESECTION  LEFT SHOULDER (Left)  Patient Location: PACU  Anesthesia Type:General  Level of Consciousness: awake, alert , oriented, patient cooperative and responds to stimulation  Airway & Oxygen Therapy: Patient Spontanous Breathing and Patient connected to face mask oxygen  Post-op Assessment: Report given to PACU RN, Post -op Vital signs reviewed and stable and Patient moving all extremities  Post vital signs: Reviewed and stable  Complications: No apparent anesthesia complications

## 2013-01-01 NOTE — Anesthesia Procedure Notes (Signed)
Anesthesia Regional Block:  Interscalene brachial plexus block  Pre-Anesthetic Checklist: ,, timeout performed, Correct Patient, Correct Site, Correct Laterality, Correct Procedure, Correct Position, site marked, Risks and benefits discussed,  Surgical consent,  Pre-op evaluation,  At surgeon's request and post-op pain management  Laterality: Left  Prep: chloraprep       Needles:  Injection technique: Single-shot  Needle Type: Echogenic Stimulator Needle     Needle Length:cm 4 cm Needle Gauge: 20 and 20 G    Additional Needles:  Procedures: ultrasound guided (picture in chart) and nerve stimulator Interscalene brachial plexus block  Nerve Stimulator or Paresthesia:  Response: 0.5 mA,   Additional Responses:   Narrative:  Start time: 01/01/2013 11:00 AM End time: 01/01/2013 11:10 AM Injection made incrementally with aspirations every 5 mL.  Performed by: Personally  Anesthesiologist: Gaetano Hawthorne MD  Additional Notes: Patient tolerated the procedure well without complications  Interscalene brachial plexus block

## 2013-01-01 NOTE — Preoperative (Signed)
Beta Blockers   Reason not to administer Beta Blockers:Not Applicable 

## 2013-01-01 NOTE — H&P (Signed)
[name] is an 67 y.o. male.   Chief Complaint: painful lt shoulder HPI: MRI demonstrates full thickness rotator cuff tear  Past Medical History  Diagnosis Date  . Depression   . Arthritis     mild lower back    Past Surgical History  Procedure Laterality Date  . Shoulder arthroscopy with rotator cuff repair and subacromial decompression  12-23-12    Right/ left shoulder(microscopic surgery)  . Joint replacement  12-23-12    right hip replaced  . Tonsillectomy      child    History reviewed. No pertinent family history. Social History:  reports that he has never smoked. He does not have any smokeless tobacco history on file. He reports that he does not drink alcohol or use illicit drugs.  Allergies: No Known Allergies  Medications Prior to Admission  Medication Sig Dispense Refill  . B Complex-C (B-COMPLEX WITH VITAMIN C) tablet Take 1 tablet by mouth daily.      Marland Kitchen buPROPion (WELLBUTRIN XL) 300 MG 24 hr tablet Take 300 mg by mouth daily before breakfast.      . DULoxetine (CYMBALTA) 60 MG capsule Take 60 mg by mouth at bedtime.       . meloxicam (MOBIC) 15 MG tablet Take 15 mg by mouth daily.      . pravastatin (PRAVACHOL) 40 MG tablet Take 40 mg by mouth daily before breakfast.      . aspirin 81 MG tablet Take 162 mg by mouth daily.       . fish oil-omega-3 fatty acids 1000 MG capsule Take 1 g by mouth daily.       . Multiple Vitamin (MULTIVITAMIN WITH MINERALS) TABS Take 1 tablet by mouth daily.        No results found for this or any previous visit (from the past 48 hour(s)). No results found.  ROS  Blood pressure 156/86, pulse 82, temperature 97.9 F (36.6 C), temperature source Oral, resp. rate 18, SpO2 97.00%. Physical Exam  Constitutional: He is oriented to person, place, and time. He appears well-developed and well-nourished.  HENT:  Head: Normocephalic and atraumatic.  Right Ear: External ear normal.  Left Ear: External ear normal.  Nose: Nose normal.   Mouth/Throat: Oropharynx is clear and moist.  Eyes: Conjunctivae and EOM are normal. Pupils are equal, round, and reactive to light.  Neck: Normal range of motion. Neck supple.  Cardiovascular: Normal rate, regular rhythm, normal heart sounds and intact distal pulses.   Respiratory: Effort normal and breath sounds normal.  GI: Soft. Bowel sounds are normal.  Musculoskeletal:  He has had an interscalene block lt shoulder  Neurological: He is alert and oriented to person, place, and time. He has normal reflexes.  Skin: Skin is warm and dry.  Psychiatric: He has a normal mood and affect. His behavior is normal. Judgment and thought content normal.     Assessment/Plan Full thickness rotator cuff tear left shoulder Open anterior acromionectomy and repair torn rotator cuff left shoulder  Rory Montel P 01/01/2013, 11:13 AM

## 2013-01-01 NOTE — Anesthesia Preprocedure Evaluation (Addendum)
Anesthesia Evaluation  Patient identified by MRN, date of birth, ID band Patient awake    Reviewed: Allergy & Precautions, H&P , NPO status , Patient's Chart, lab work & pertinent test results  Airway Mallampati: II TM Distance: >3 FB Neck ROM: full    Dental no notable dental hx. (+) Teeth Intact and Dental Advisory Given   Pulmonary neg pulmonary ROS,  breath sounds clear to auscultation  Pulmonary exam normal       Cardiovascular Exercise Tolerance: Good negative cardio ROS  Rhythm:regular Rate:Normal     Neuro/Psych negative neurological ROS  negative psych ROS   GI/Hepatic negative GI ROS, Neg liver ROS,   Endo/Other  negative endocrine ROS  Renal/GU negative Renal ROS  negative genitourinary   Musculoskeletal   Abdominal   Peds  Hematology negative hematology ROS (+)   Anesthesia Other Findings   Reproductive/Obstetrics negative OB ROS                          Anesthesia Physical Anesthesia Plan  ASA: II  Anesthesia Plan: General   Post-op Pain Management:    Induction: Intravenous  Airway Management Planned: Oral ETT  Additional Equipment:   Intra-op Plan:   Post-operative Plan: Extubation in OR  Informed Consent: I have reviewed the patients History and Physical, chart, labs and discussed the procedure including the risks, benefits and alternatives for the proposed anesthesia with the patient or authorized representative who has indicated his/her understanding and acceptance.   Dental Advisory Given  Plan Discussed with: CRNA and Surgeon  Anesthesia Plan Comments:        Anesthesia Quick Evaluation  

## 2013-01-02 ENCOUNTER — Encounter (HOSPITAL_COMMUNITY): Payer: Self-pay | Admitting: Orthopedic Surgery

## 2013-01-02 MED ORDER — METHOCARBAMOL 500 MG PO TABS: 500.0000 mg | ORAL_TABLET | Freq: Four times a day (QID) | ORAL | Status: DC | PRN

## 2013-01-02 MED ORDER — METHOCARBAMOL 500 MG PO TABS: 500.0000 mg | ORAL_TABLET | Freq: Four times a day (QID) | ORAL | Status: DC

## 2013-01-02 MED ORDER — HYDROCODONE-ACETAMINOPHEN 10-325 MG PO TABS: 1.0000 | ORAL_TABLET | Freq: Four times a day (QID) | ORAL | Status: DC | PRN

## 2013-01-02 NOTE — Progress Notes (Signed)
UR completed 

## 2013-01-02 NOTE — Discharge Summary (Signed)
[name], [name] NO.:  [id number]  MEDICAL RECORD NO.:  [id number]  LOCATION:  1604                         FACILITY:  [facility]  PHYSICIAN:  Marlowe Kays, M.D.  DATE OF BIRTH:  1946-06-15  DATE OF ADMISSION:  01/01/2013 DATE OF DISCHARGE:  01/02/2013                              DISCHARGE SUMMARY   ADMITTING DIAGNOSIS:  Rotator cuff tear, left shoulder.  DISCHARGE DIAGNOSIS:  Rotator cuff tear, left shoulder.  OPERATION:  Anterior acromionectomy with repair of torn rotator cuff and distal clavicle decompression, left shoulder, on Jan 01, 2013.  SUMMARY:  [name] had at least a 64-month history of progressive pain in the left shoulder.  I previously performed a successful rotator cuff repair in his right shoulder several years ago.  An MRI of his left shoulder demonstrated the above diagnosis.  At surgery, he had a severe tear as noted on the MRI including the entire rotator cuff front to back, and also 4.5 cm of retraction.  The rotator cuff was rather inflexible despite my trying to free it up.  I was able to reattach close to its anatomic location.  We also performed partial resection of the inferior portion of the distal clavicle, which appeared to be impinging on the rotator cuff as well.  At discharge, his interscalene block had worn off.  He was comfortable.  He will be discharged on Norco 10 mg and Robaxin 500 mg with instructions to stay in the sling except to move his elbow and keep the dressing dry.  I am also going to seen him back in the office on Jan 06, 2013, and he will call to make an appointment.  CONDITION ON DISCHARGE:  Stable and improved.          ______________________________ Marlowe Kays, M.D.     JA/MEDQ  D:  01/02/2013  T:  01/02/2013  Job:  [id number]

## 2013-01-02 NOTE — Evaluation (Signed)
Occupational Therapy Evaluation Patient Details Name: [name] MRN: [id number] DOB: Jan 06, 1946 Today's Date: 01/02/2013 T  OT Assessment / Plan / Recommendation Clinical Impression  Pt presents to OT s/p RTC repair. Pt educated regarding ADL activity and not moving shoulder. OT will need to educate pts wife prior to DC    OT Assessment  Patient needs continued OT Services    Follow Up Recommendations  No OT follow up       Equipment Recommendations  None recommended by OT       Frequency  Min 2X/week    Precautions / Restrictions Precautions Precautions: Shoulder Type of Shoulder Precautions: NWB and no shoulder movement.         ADL       OT Diagnosis: Generalized weakness;Acute pain  OT Problem List: Decreased strength;Pain;Impaired UE functional use OT Treatment Interventions: Self-care/ADL training;Patient/family education   OT Goals Acute Rehab OT Goals OT Goal Formulation: With patient Potential to Achieve Goals: Good Arm Goals Arm Goal: Additional Goal #1 - Progress: Other (comment)     Subjective Data  Subjective: my wife will be here in a bit and you can show her                  Exercise Donning/doffing shirt without moving shoulder: Minimal assistance Method for sponge bathing under operated UE: Minimal assistance Donning/doffing sling/immobilizer: Minimal assistance Correct positioning of sling/immobilizer: Minimal assistance ROM for elbow, wrist and digits of operated UE: Minimal assistance Sling wearing schedule (on at all times/off for ADL's): Minimal assistance Proper positioning of operated UE when showering: Minimal assistance Positioning of UE while sleeping: Minimal assistance      End of Session OT - End of Session Activity Tolerance: Patient tolerated treatment well Patient left: in chair;with call bell/phone within reach  GO Functional Assessment Tool Used: clinical observation Functional Limitation: Self care Self Care  Current Status (W0981): At least 20 percent but less than 40 percent impaired, limited or restricted Self Care Goal Status (X9147): At least 1 percent but less than 20 percent impaired, limited or restricted   Legacy [place], Metro Kung 01/02/2013, 10:08 AM

## 2013-01-02 NOTE — Op Note (Signed)
[name], [name] NO.:  [id number]  MEDICAL RECORD NO.:  [id number]  LOCATION:  1604                         FACILITY:  [facility]  PHYSICIAN:  Marlowe Kays, M.D.  DATE OF BIRTH:  [birth year]  DATE OF PROCEDURE:  01/01/2013 DATE OF DISCHARGE:                              OPERATIVE REPORT   PREOPERATIVE DIAGNOSIS:  Full-thickness retracted rotator cuff tear, left shoulder.  POSTOPERATIVE DIAGNOSIS:  Full-thickness retracted rotator cuff tear, left shoulder.  OPERATION:  Anterior acromionectomy and partial resection of the inferior surface of the distal clavicle with repair of rotator cuff tear, left shoulder.  SURGEON:  Marlowe Kays, MD.  ASSISTANTDruscilla Brownie. Adrian Blackwater.  ANESTHESIA:  General preceded by interscalene block.  PATHOLOGY AND JUSTIFICATION FOR PROCEDURE:  He has had a painful shoulder now for 7 months.  An MRI on November 25, 2012, demonstrated the above-mentioned diagnoses.  Based on this, he is here today for the above-mentioned surgery.  Mr. Angie Fava assistance was necessary because of the complexity of the procedure.  PROCEDURE IN DETAIL:  Satisfactory interscalene block by anesthesia. Satisfactory general anesthesia, beach chair position on the sliding frame, left shoulder was prepped with DuraPrep, draped in sterile field. Prophylactic antibiotics utilized.  Time-out performed.  Ioban employed. Vertical midline incision from roughly the [facility] joint down to the greater tuberosity.  Incision was carried down to the fascia over the anterior acromion, which I opened with a cutting cautery to expose the anterior acromion.  After undermining the undersurface of the acromion, joint fluid came forth confirming the tear.  I made my first anterior acromionectomy, placing a Cobb elevator beneath the anterior acromion using a micro saw.  I then had to remove additional bone from the undersurface of the acromion and as it turned out, he also  had prominence of the undersurface of the distal clavicle as well, which I removed with a saw, rongeur and rasp.  This thoroughly decompressed the subacromial space.  The rotator cuff tear was as depicted on the MRI, which was complete from anterior to posterior, but also with about 4.5 cm of retraction and some lamination of the anterior portion.  I first tried to free up the rotator cuff from the surrounding tissues.  The problem with the rotator cuff tear was that it just was not very mobile. He had some delamination of the anterior third and I repaired this with several horizontal mattress sutures of #1 Ethibond, which I then also used as retractors to try and bring the rotator cuff more laterally. After freeing up the tear as much as I could, I then used the first 2 of 4 stranded Stryker rotator cuff anchors, melding them into the anterior half of the tear and bringing the rotator cuff close to its anatomic position at the greater tuberosity.  Then by internally rotating the arm, I used a second anchor and was able to likewise bring the posterior rotator cuff portion laterally as well.  The final construct was such that there was what appeared to be a nice solid repair other than the fact that the rotator cuff could not be brought completely to its normal lateral position.  Movement of the  arm did not seem to affect the mobility of the rotator cuff.  After obtaining the best repair I could and evaluating the  situation, I did not feel that any graft would be helpful.  Repair was solid to the bone lateralward.  I did not feel that the graft would have had anything.  Accordingly, the wound was then irrigated with sterile saline, and I reapproximated the fascia over the distal clavicle and anterior acromion and small interval incision in the deltoid with interrupted #1 Vicryl.  Subcutaneous tissue was closed with combination of interrupted #1 and 2-0 Vicryl with staples in the  skin. Betadine, Adaptic, dry sterile dressing were applied.  He tolerated the procedure well, and was taken to the recovery room in satisfied condition with no known complications.          ______________________________ Marlowe Kays, M.D.     JA/MEDQ  D:  01/01/2013  T:  01/02/2013  Job:  [id number]

## 2013-01-02 NOTE — Progress Notes (Signed)
Occupational Therapy Treatment Patient Details Name: [name] MRN: [id number] DOB: [birth year] Today's Date: 01/02/2013 Time: 4098-1191 OT Time Calculation (min): 12 min  OT Assessment / Plan / Recommendation Comments on Treatment Session Alll education now complete with caregiver. Handout provided.  Pt and caregiver do not have any further questions    Follow Up Recommendations  No OT follow up       Equipment Recommendations  None recommended by OT       Frequency Min 2X/week   Plan Discharge plan remains appropriate    Precautions / Restrictions Precautions Precautions: Shoulder Type of Shoulder Precautions: NWB and no shoulder movement.      OT Diagnosis: Generalized weakness;Acute pain  OT Problem List: Decreased strength;Pain;Impaired UE functional use OT Treatment Interventions: Self-care/ADL training;Patient/family education   OT Goals Acute Rehab OT Goals OT Goal Formulation: With patient Potential to Achieve Goals: Good Arm Goals Additional Arm Goal #1: Pt and caregiver will be I with donning shirt, sling, washing under operative arm, as well as ROM for elbow, wrist and hand Arm Goal: Additional Goal #1 - Progress: Met  Visit Information  Last OT Received On: 01/02/13    Subjective Data  Subjective: wife now present for education   Prior Functioning             Exercises  Donning/doffing shirt without moving shoulder: Caregiver independent with task Method for sponge bathing under operated UE: Caregiver independent with task Donning/doffing sling/immobilizer: Caregiver independent with task Correct positioning of sling/immobilizer: Caregiver independent with task ROM for elbow, wrist and digits of operated UE: Caregiver independent with task Sling wearing schedule (on at all times/off for ADL's): Caregiver independent with task Proper positioning of operated UE when showering: Caregiver independent with task Positioning of UE while sleeping:  Caregiver independent with task      End of Session OT - End of Session Activity Tolerance: Patient tolerated treatment well Patient left: in chair;with call bell/phone within reach  GO Functional Assessment Tool Used: clinical observation Functional Limitation: Self care Self Care Current Status (Y7829): At least 20 percent but less than 40 percent impaired, limited or restricted Self Care Goal Status (F6213): At least 1 percent but less than 20 percent impaired, limited or restricted Self Care Discharge Status [phone]): At least 20 percent but less than 40 percent impaired, limited or restricted   Ferdinando Lodge, Metro Kung 01/02/2013, 10:57 AM

## 2013-08-18 ENCOUNTER — Ambulatory Visit: Payer: Medicare Other | Admitting: Internal Medicine

## 2013-08-18 VITALS — BP 154/90 | HR 81 | Temp 98.2°F | Resp 16 | Ht 68.0 in | Wt 225.4 lb

## 2013-08-18 DIAGNOSIS — Z0289 Encounter for other administrative examinations: Secondary | ICD-10-CM

## 2013-08-18 NOTE — Progress Notes (Signed)
   Subjective:    Patient ID: [name], male    DOB: April 11, 1946, 67 y.o.   MRN: [id number]  HPI Here for DOT self pay. Depression has resolved on medication.   Review of Systems  Constitutional: Negative.   HENT: Negative.   Eyes: Negative.   Respiratory: Negative.   Endocrine: Negative.   Genitourinary: Negative.   Musculoskeletal: Positive for arthralgias.  Skin: Negative.   Allergic/Immunologic: Negative.   Neurological: Negative.   Hematological: Negative.   Psychiatric/Behavioral: Negative.        Objective:   Physical Exam  Constitutional: He is oriented to person, place, and time. He appears well-developed and well-nourished.  HENT:  Head: Normocephalic.  Right Ear: External ear normal.  Left Ear: External ear normal.  Nose: Nose normal.  Mouth/Throat: Oropharynx is clear and moist.  Eyes: Conjunctivae and EOM are normal. Pupils are equal, round, and reactive to light.  Neck: Normal range of motion. Neck supple. No tracheal deviation present. No thyromegaly present.  Cardiovascular: Normal rate, regular rhythm, normal heart sounds and intact distal pulses.   Pulmonary/Chest: Effort normal and breath sounds normal.  Abdominal: Soft. Bowel sounds are normal.  Genitourinary: Penis normal.  Musculoskeletal: Normal range of motion.  Neurological: He is alert and oriented to person, place, and time. He has normal reflexes. He exhibits normal muscle tone. Coordination normal.  Psychiatric: He has a normal mood and affect. His behavior is normal. Judgment and thought content normal.          Assessment & Plan:  DOT 2 year

## 2013-08-18 NOTE — Patient Instructions (Signed)
DASH Diet The DASH diet stands for "Dietary Approaches to Stop Hypertension." It is a healthy eating plan that has been shown to reduce high blood pressure (hypertension) in as little as 14 days, while also possibly providing other significant health benefits. These other health benefits include reducing the risk of breast cancer after menopause and reducing the risk of type 2 diabetes, heart disease, colon cancer, and stroke. Health benefits also include weight loss and slowing kidney failure in patients with chronic kidney disease.  DIET GUIDELINES  Limit salt (sodium). Your diet should contain less than 1500 mg of sodium daily.  Limit refined or processed carbohydrates. Your diet should include mostly whole grains. Desserts and added sugars should be used sparingly.  Include small amounts of heart-healthy fats. These types of fats include nuts, oils, and tub margarine. Limit saturated and trans fats. These fats have been shown to be harmful in the body. CHOOSING FOODS  The following food groups are based on a 2000 calorie diet. See your Registered Dietitian for individual calorie needs. Grains and Grain Products (6 to 8 servings daily)  Eat More Often: Whole-wheat bread, brown rice, whole-grain or wheat pasta, quinoa, popcorn without added fat or salt (air popped).  Eat Less Often: White bread, white pasta, white rice, cornbread. Vegetables (4 to 5 servings daily)  Eat More Often: Fresh, frozen, and canned vegetables. Vegetables may be raw, steamed, roasted, or grilled with a minimal amount of fat.  Eat Less Often/Avoid: Creamed or fried vegetables. Vegetables in a cheese sauce. Fruit (4 to 5 servings daily)  Eat More Often: All fresh, canned (in natural juice), or frozen fruits. Dried fruits without added sugar. One hundred percent fruit juice ( cup [237 mL] daily).  Eat Less Often: Dried fruits with added sugar. Canned fruit in light or heavy syrup. Lean Meats, Fish, and Poultry (2  servings or less daily. One serving is 3 to 4 oz [85-114 g]).  Eat More Often: Ninety percent or leaner ground beef, tenderloin, sirloin. Round cuts of beef, chicken breast, turkey breast. All fish. Grill, bake, or broil your meat. Nothing should be fried.  Eat Less Often/Avoid: Fatty cuts of meat, turkey, or chicken leg, thigh, or wing. Fried cuts of meat or fish. Dairy (2 to 3 servings)  Eat More Often: Low-fat or fat-free milk, low-fat plain or light yogurt, reduced-fat or part-skim cheese.  Eat Less Often/Avoid: Milk (whole, 2%).Whole milk yogurt. Full-fat cheeses. Nuts, Seeds, and Legumes (4 to 5 servings per week)  Eat More Often: All without added salt.  Eat Less Often/Avoid: Salted nuts and seeds, canned beans with added salt. Fats and Sweets (limited)  Eat More Often: Vegetable oils, tub margarines without trans fats, sugar-free gelatin. Mayonnaise and salad dressings.  Eat Less Often/Avoid: Coconut oils, palm oils, butter, stick margarine, cream, half and half, cookies, candy, pie. FOR MORE INFORMATION The Dash Diet Eating Plan: www.dashdiet.org Document Released: 08/03/2011 Document Revised: 11/06/2011 Document Reviewed: 08/03/2011 [employer] Patient Information 2014 [employer]. Calorie Counting Diet A calorie counting diet requires you to eat the number of calories that are right for you in a day. Calories are the measurement of how much energy you get from the food you eat. Eating the right amount of calories is important for staying at a healthy weight. If you eat too many calories, your body will store them as fat and you may gain weight. If you eat too few calories, you may lose weight. Counting the number of calories you eat during   a day will help you know if you are eating the right amount. A Registered Dietitian can determine how many calories you need in a day. The amount of calories needed varies from person to person. If your goal is to lose weight, you will need  to eat fewer calories. Losing weight can benefit you if you are overweight or have health problems such as heart disease, high blood pressure, or diabetes. If your goal is to gain weight, you will need to eat more calories. Gaining weight may be necessary if you have a certain health problem that causes your body to need more energy. TIPS Whether you are increasing or decreasing the number of calories you eat during a day, it may be hard to get used to changes in what you eat and drink. The following are tips to help you keep track of the number of calories you eat.  Measure foods at home with measuring cups. This helps you know the amount of food and number of calories you are eating.  Restaurants often serve food in amounts that are larger than 1 serving. While eating out, estimate how many servings of a food you are given. For example, a serving of cooked rice is  cup or about the size of half of a fist. Knowing serving sizes will help you be aware of how much food you are eating at restaurants.  Ask for smaller portion sizes or child-size portions at restaurants.  Plan to eat half of a meal at a restaurant. Take the rest home or share the other half with a friend.  Read the Nutrition Facts panel on food labels for calorie content and serving size. You can find out how many servings are in a package, the size of a serving, and the number of calories each serving has.  For example, a package might contain 3 cookies. The Nutrition Facts panel on that package says that 1 serving is 1 cookie. Below that, it will say there are 3 servings in the container. The calories section of the Nutrition Facts label says there are 90 calories. This means there are 90 calories in 1 cookie (1 serving). If you eat 1 cookie you have eaten 90 calories. If you eat all 3 cookies, you have eaten 270 calories (3 servings x 90 calories = 270 calories). The list below tells you how big or small some common portion sizes  are.  1 oz.........4 stacked dice.  3 oz.........Deck of cards.  1 tsp........Tip of little finger.  1 tbs........Thumb.  2 tbs........Golf ball.   cup.......Half of a fist.  1 cup........A fist. KEEP A FOOD LOG Write down every food item you eat, the amount you eat, and the number of calories in each food you eat during the day. At the end of the day, you can add up the total number of calories you have eaten. It may help to keep a list like the one below. Find out the calorie information by reading the Nutrition Facts panel on food labels. Breakfast  Bran cereal (1 cup, 110 calories).  Fat-free milk ( cup, 45 calories). Snack  Apple (1 medium, 80 calories). Lunch  Spinach (1 cup, 20 calories).  Tomato ( medium, 20 calories).  Chicken breast strips (3 oz, 165 calories).  Shredded cheddar cheese ( cup, 110 calories).  Light Italian dressing (2 tbs, 60 calories).  Whole-wheat bread (1 slice, 80 calories).  Tub margarine (1 tsp, 35 calories).  Vegetable soup (1 cup, 160 calories). Dinner    Pork chop (3 oz, 190 calories).  Brown rice (1 cup, 215 calories).  Steamed broccoli ( cup, 20 calories).  Strawberries (1  cup, 65 calories).  Whipped cream (1 tbs, 50 calories). Daily Calorie Total: 1425 Document Released: 08/14/2005 Document Revised: 11/06/2011 Document Reviewed: 02/08/2007 Exit[employer] Patient Information 2014 [employer].  

## 2013-08-28 DIAGNOSIS — S3993XA Unspecified injury of pelvis, initial encounter: Secondary | ICD-10-CM

## 2013-08-28 HISTORY — DX: Unspecified injury of pelvis, initial encounter: S39.93XA

## 2015-02-17 ENCOUNTER — Ambulatory Visit (INDEPENDENT_AMBULATORY_CARE_PROVIDER_SITE_OTHER): Payer: Medicare Other

## 2015-02-17 ENCOUNTER — Ambulatory Visit (INDEPENDENT_AMBULATORY_CARE_PROVIDER_SITE_OTHER): Payer: Medicare Other | Admitting: Podiatry

## 2015-02-17 ENCOUNTER — Encounter: Payer: Self-pay | Admitting: Podiatry

## 2015-02-17 VITALS — BP 134/75 | HR 67 | Resp 15

## 2015-02-17 DIAGNOSIS — M722 Plantar fascial fibromatosis: Secondary | ICD-10-CM

## 2015-02-17 DIAGNOSIS — M205X1 Other deformities of toe(s) (acquired), right foot: Secondary | ICD-10-CM | POA: Diagnosis not present

## 2015-02-17 DIAGNOSIS — M2042 Other hammer toe(s) (acquired), left foot: Secondary | ICD-10-CM | POA: Diagnosis not present

## 2015-02-17 MED ORDER — TRIAMCINOLONE ACETONIDE 10 MG/ML IJ SUSP
10.0000 mg | Freq: Once | INTRAMUSCULAR | Status: AC
  Administered 2015-02-17: 10 mg

## 2015-02-17 NOTE — Progress Notes (Signed)
   Subjective:    Patient ID: [name], male    DOB: 1946-02-11, 69 y.o.   MRN: [id number]  HPI  Pt presents with left heel pain, lasting for ABOUT 1 MONTH NOW, HE STATES THAT PAIN IS ON THE BOTTOM OF THE HEEL, HE HAS TAKEN ALEVE AND SOMETIMES A NARCOTIC BASED PAIN PILL WITH NO RELIEF  Review of Systems  Respiratory: Positive for shortness of breath.   Musculoskeletal: Positive for back pain.  All other systems reviewed and are negative.      Objective:   Physical Exam        Assessment & Plan:

## 2015-02-17 NOTE — Progress Notes (Signed)
Subjective:     Patient ID: [name], male   DOB: 04/14/1946, 69 y.o.   MRN: [id number]  HPI patient presents with several problems one being heel pain left at the insertional point tendon into the calcaneus along with structural bunion deformity bilateral right over left and a elevated second toe left foot with rigid contracture at the MPJ. States the heels been present for several months   Review of Systems  All other systems reviewed and are negative.      Objective:   Physical Exam  Constitutional: He is oriented to person, place, and time.  Cardiovascular: Intact distal pulses.   Musculoskeletal: Normal range of motion.  Neurological: He is oriented to person, place, and time.  Skin: Skin is warm.  Nursing note and vitals reviewed.  neurovascular status intact with muscle strength adequate range of motion within normal limits. Patient's noted to have quite a bit of discomfort in the heel region left upon palpation and is noted to have large structural bunion deformity right over left with rigid elevated second toe left foot with redness on top of the toe     Assessment:     Acute plantar fasciitis along with chronic structural bunion and hammertoe deformity    Plan:     H&P and x-rays reviewed with patient bilateral. Today injected the left plantar fascia 3 mg Kenalog 5 mill grams Xylocaine and applied fascial brace and gave instructions on physical therapy

## 2015-02-17 NOTE — Patient Instructions (Signed)

## 2015-02-25 ENCOUNTER — Ambulatory Visit (INDEPENDENT_AMBULATORY_CARE_PROVIDER_SITE_OTHER): Payer: Medicare Other | Admitting: Podiatry

## 2015-02-25 ENCOUNTER — Encounter: Payer: Self-pay | Admitting: Podiatry

## 2015-02-25 VITALS — BP 127/73 | HR 89 | Resp 15

## 2015-02-25 DIAGNOSIS — M722 Plantar fascial fibromatosis: Secondary | ICD-10-CM | POA: Diagnosis not present

## 2015-02-25 DIAGNOSIS — M2042 Other hammer toe(s) (acquired), left foot: Secondary | ICD-10-CM | POA: Diagnosis not present

## 2015-02-25 DIAGNOSIS — M205X1 Other deformities of toe(s) (acquired), right foot: Secondary | ICD-10-CM | POA: Diagnosis not present

## 2015-02-25 NOTE — Progress Notes (Signed)
Subjective:     Patient ID: [name], male   DOB: 1945/12/14, 69 y.o.   MRN: [id number]  HPI patient states my left foot feels quite a bit better with minimal discomfort and I'm able to walk distances and my toe and bunion well symptomatic with certain shoes do not bother me that bad   Review of Systems     Objective:   Physical Exam Neurovascular status unchanged muscle strength adequate with significant diminishment of discomfort left plantar heel upon deep palpation and noted to have continued rigid contracture second digit left and structural bunion deformity right    Assessment:     Improving plantar fasciitis left with structural bunion deformity right hammertoe deformity second left    Plan:     Reviewed all conditions and recommended physical therapy supportive shoe gear usage and anti-inflammatory cyst needed and will be seen back if symptoms recur or if structural bunion hammertoe were to start to give the patient's symptoms

## 2017-11-06 ENCOUNTER — Other Ambulatory Visit: Payer: Self-pay | Admitting: Orthopedic Surgery

## 2017-11-06 DIAGNOSIS — M48061 Spinal stenosis, lumbar region without neurogenic claudication: Secondary | ICD-10-CM

## 2017-11-08 NOTE — Progress Notes (Signed)
Reviewed medication list and allergies with patient's wife. Informed her pt would need to stop wellbutrin and cymbalta 48hrs prior to the myelogram procedure appointment time. Pt's wife verbalized understanding.

## 2017-11-22 ENCOUNTER — Ambulatory Visit
Admission: RE | Admit: 2017-11-22 | Discharge: 2017-11-22 | Disposition: A | Discharge status: Home / Self Care | Payer: Medicare Other | Source: Ambulatory Visit | Attending: Orthopedic Surgery | Admitting: Orthopedic Surgery

## 2017-11-22 DIAGNOSIS — M48061 Spinal stenosis, lumbar region without neurogenic claudication: Secondary | ICD-10-CM

## 2017-11-22 MED ORDER — DIAZEPAM 5 MG PO TABS
5.0000 mg | ORAL_TABLET | Freq: Once | ORAL | Status: AC
  Administered 2017-11-22: 5 mg via ORAL

## 2017-11-22 MED ORDER — IOPAMIDOL (ISOVUE-M 200) INJECTION 41%
15.0000 mL | Freq: Once | INTRAMUSCULAR | Status: AC
  Administered 2017-11-22: 15 mL via INTRATHECAL

## 2017-11-22 NOTE — Progress Notes (Signed)
Patient states he has been off Cymbalta and Wellbutrin for at least the past two days.

## 2017-11-22 NOTE — Discharge Instructions (Signed)
Myelogram Discharge Instructions  1. Go home and rest quietly for the next 24 hours.  It is important to lie flat for the next 24 hours.  Get up only to go to the restroom.  You may lie in the bed or on a couch on your back, your stomach, your left side or your right side.  You may have one pillow under your head.  You may have pillows between your knees while you are on your side or under your knees while you are on your back.  2. DO NOT drive today.  Recline the seat as far back as it will go, while still wearing your seat belt, on the way home.  3. You may get up to go to the bathroom as needed.  You may sit up for 10 minutes to eat.  You may resume your normal diet and medications unless otherwise indicated.  Drink lots of extra fluids today and tomorrow.  4. The incidence of headache, nausea, or vomiting is about 5% (one in 20 patients).  If you develop a headache, lie flat and drink plenty of fluids until the headache goes away.  Caffeinated beverages may be helpful.  If you develop severe nausea and vomiting or a headache that does not go away with flat bed rest, call [phone].  5. You may resume normal activities after your 24 hours of bed rest is over; however, do not exert yourself strongly or do any heavy lifting tomorrow. If when you get up you have a headache when standing, go back to bed and force fluids for another 24 hours.  6. Call your physician for a follow-up appointment.  The results of your myelogram will be sent directly to your physician by the following day.  7. If you have any questions or if complications develop after you arrive home, please call [phone].  Discharge instructions have been explained to the patient.  The patient, or the person responsible for the patient, fully understands these instructions.  [facility] TOM[facility] 11/23/2017 AT 10:30AM.

## 2017-12-05 NOTE — Patient Instructions (Signed)
Council MechanicBurton L Zuba  12/05/2017   Your procedure is scheduled on: 12/12/2017   [facility]  Report to admitting at    0630 AM  Call this number if you have problems the morning of surgery [phone]   Remember: Do not eat food or drink liquids :After Midnight.     Take these medicines the morning of surgery with A SIP OF WATER: Wellbutrin                                 You may not have any metal on your body including hair pins and              piercings  Do not wear jewelry, , lotions, powders or perfumes, deodorant                          Men may shave face and neck.   Do not bring valuables to the hospital. [facility] IS NOT             RESPONSIBLE   FOR VALUABLES.  Contacts, dentures or bridgework may not be worn into surgery.  Leave suitcase in the car. After surgery it may be brought to your room.                   Please read over the following fact sheets you were given: _____________________________________________________________________             [facility] Before surgery, you can play an important role.  Because skin is not sterile, your skin needs to be as free of germs as possible.  You can reduce the number of germs on your skin by washing with CHG (chlorahexidine gluconate) soap before surgery.  CHG is an antiseptic cleaner which kills germs and bonds with the skin to continue killing germs even after washing. Please DO NOT use if you have an allergy to CHG or antibacterial soaps.  If your skin becomes reddened/irritated stop using the CHG and inform your nurse when you arrive at Short Stay. Do not shave (including legs and underarms) for at least 48 hours prior to the first CHG shower.  You may shave your face/neck. Please follow these instructions carefully:  1.  Shower with CHG Soap the night before surgery and the  morning of Surgery.  2.  If you choose to wash your hair, wash your  hair first as usual with your  normal  shampoo.  3.  After you shampoo, rinse your hair and body thoroughly to remove the  shampoo.                           4.  Use CHG as you would any other liquid soap.  You can apply chg directly  to the skin and wash                       Gently with a scrungie or clean washcloth.  5.  Apply the CHG Soap to your body ONLY FROM THE NECK DOWN.   Do not use on face/ open  Wound or open sores. Avoid contact with eyes, ears mouth and genitals (private parts).                       Wash face,  Genitals (private parts) with your normal soap.             6.  Wash thoroughly, paying special attention to the area where your surgery  will be performed.  7.  Thoroughly rinse your body with warm water from the neck down.  8.  DO NOT shower/wash with your normal soap after using and rinsing off  the CHG Soap.                9.  Pat yourself dry with a clean towel.            10.  Wear clean pajamas.            11.  Place clean sheets on your bed the night of your first shower and do not  sleep with pets. Day of Surgery : Do not apply any lotions/deodorants the morning of surgery.  Please wear clean clothes to the hospital/surgery center.  FAILURE TO FOLLOW THESE INSTRUCTIONS MAY RESULT IN THE CANCELLATION OF YOUR SURGERY PATIENT SIGNATURE_________________________________  NURSE SIGNATURE__________________________________  ________________________________________________________________________

## 2017-12-05 NOTE — Progress Notes (Signed)
Surgery on 12/12/2017.  Preop on 12/06/2017 at 0800.  Need orders in epic.

## 2017-12-06 ENCOUNTER — Encounter (HOSPITAL_COMMUNITY): Payer: Self-pay

## 2017-12-06 ENCOUNTER — Ambulatory Visit (HOSPITAL_COMMUNITY)
Admission: RE | Admit: 2017-12-06 | Discharge: 2017-12-06 | Disposition: A | Discharge status: Home / Self Care | Payer: Medicare Other | Source: Ambulatory Visit | Attending: Surgical | Admitting: Surgical

## 2017-12-06 ENCOUNTER — Other Ambulatory Visit: Payer: Self-pay

## 2017-12-06 ENCOUNTER — Other Ambulatory Visit: Payer: Self-pay | Admitting: Family Medicine

## 2017-12-06 ENCOUNTER — Encounter (HOSPITAL_COMMUNITY)
Admission: RE | Admit: 2017-12-06 | Discharge: 2017-12-06 | Disposition: A | Discharge status: Home / Self Care | Payer: Medicare Other | Source: Ambulatory Visit | Attending: Orthopedic Surgery | Admitting: Orthopedic Surgery

## 2017-12-06 ENCOUNTER — Other Ambulatory Visit (HOSPITAL_COMMUNITY): Payer: Self-pay | Admitting: *Deleted

## 2017-12-06 ENCOUNTER — Ambulatory Visit
Admission: RE | Admit: 2017-12-06 | Discharge: 2017-12-06 | Disposition: A | Discharge status: Home / Self Care | Payer: Medicare Other | Source: Ambulatory Visit | Attending: Family Medicine | Admitting: Family Medicine

## 2017-12-06 DIAGNOSIS — M545 Low back pain, unspecified: Secondary | ICD-10-CM

## 2017-12-06 DIAGNOSIS — Z01812 Encounter for preprocedural laboratory examination: Secondary | ICD-10-CM | POA: Insufficient documentation

## 2017-12-06 DIAGNOSIS — R9431 Abnormal electrocardiogram [ECG] [EKG]: Secondary | ICD-10-CM | POA: Diagnosis not present

## 2017-12-06 DIAGNOSIS — M47816 Spondylosis without myelopathy or radiculopathy, lumbar region: Secondary | ICD-10-CM | POA: Insufficient documentation

## 2017-12-06 DIAGNOSIS — Z01810 Encounter for preprocedural cardiovascular examination: Secondary | ICD-10-CM | POA: Insufficient documentation

## 2017-12-06 DIAGNOSIS — R7981 Abnormal blood-gas level: Secondary | ICD-10-CM

## 2017-12-06 DIAGNOSIS — Z01818 Encounter for other preprocedural examination: Secondary | ICD-10-CM | POA: Diagnosis not present

## 2017-12-06 HISTORY — DX: Spinal stenosis, lumbar region without neurogenic claudication: M48.061

## 2017-12-06 HISTORY — DX: Unspecified injury of pelvis, initial encounter: S39.93XA

## 2017-12-06 HISTORY — DX: Dyspnea, unspecified: R06.00

## 2017-12-06 HISTORY — DX: Benign prostatic hyperplasia without lower urinary tract symptoms: N40.0

## 2017-12-06 LAB — URINALYSIS, ROUTINE W REFLEX MICROSCOPIC
Bilirubin Urine: NEGATIVE
Glucose, UA: NEGATIVE mg/dL
Ketones, ur: NEGATIVE mg/dL
Leukocytes, UA: NEGATIVE
Nitrite: NEGATIVE
Protein, ur: NEGATIVE mg/dL
Specific Gravity, Urine: 1.023 (ref 1.005–1.030)
Squamous Epithelial / LPF: NONE SEEN
pH: 5 (ref 5.0–8.0)

## 2017-12-06 LAB — CBC WITH DIFFERENTIAL/PLATELET
Basophils Absolute: 0 10*3/uL (ref 0.0–0.1)
Basophils Relative: 1 %
Eosinophils Absolute: 0.5 10*3/uL (ref 0.0–0.7)
Eosinophils Relative: 7 %
HCT: 46.2 % (ref 39.0–52.0)
Hemoglobin: 15.2 g/dL (ref 13.0–17.0)
Lymphocytes Relative: 25 %
Lymphs Abs: 2 10*3/uL (ref 0.7–4.0)
MCH: 30.8 pg (ref 26.0–34.0)
MCHC: 32.9 g/dL (ref 30.0–36.0)
MCV: 93.7 fL (ref 78.0–100.0)
Monocytes Absolute: 0.7 10*3/uL (ref 0.1–1.0)
Monocytes Relative: 8 %
Neutro Abs: 4.7 10*3/uL (ref 1.7–7.7)
Neutrophils Relative %: 59 %
Platelets: 204 10*3/uL (ref 150–400)
RBC: 4.93 MIL/uL (ref 4.22–5.81)
RDW: 13.4 % (ref 11.5–15.5)
WBC: 7.9 10*3/uL (ref 4.0–10.5)

## 2017-12-06 LAB — PROTIME-INR
INR: 0.97
Prothrombin Time: 12.8 seconds (ref 11.4–15.2)

## 2017-12-06 LAB — COMPREHENSIVE METABOLIC PANEL
ALT: 29 U/L (ref 17–63)
AST: 26 U/L (ref 15–41)
Albumin: 4.1 g/dL (ref 3.5–5.0)
Alkaline Phosphatase: 88 U/L (ref 38–126)
Anion gap: 10 (ref 5–15)
BUN: 17 mg/dL (ref 6–20)
CO2: 26 mmol/L (ref 22–32)
Calcium: 9.4 mg/dL (ref 8.9–10.3)
Chloride: 107 mmol/L (ref 101–111)
Creatinine, Ser: 0.9 mg/dL (ref 0.61–1.24)
GFR calc Af Amer: >60 mL/min (ref >60)
GFR calc non Af Amer: >60 mL/min (ref >60)
Glucose, Bld: 98 mg/dL (ref 65–99)
Potassium: 4 mmol/L (ref 3.5–5.1)
Sodium: 143 mmol/L (ref 135–145)
Total Bilirubin: 1.1 mg/dL (ref 0.3–1.2)
Total Protein: 7.9 g/dL (ref 6.5–8.1)

## 2017-12-06 LAB — SURGICAL PCR SCREEN
MRSA, PCR: NEGATIVE
STAPHYLOCOCCUS AUREUS: NEGATIVE

## 2017-12-06 LAB — APTT: aPTT: 33 seconds (ref 24–36)

## 2017-12-07 ENCOUNTER — Inpatient Hospital Stay (HOSPITAL_COMMUNITY): Admission: RE | Admit: 2017-12-07 | Payer: Medicare Other | Source: Ambulatory Visit

## 2017-12-11 MED ORDER — BUPIVACAINE LIPOSOME 1.3 % IJ SUSP
20.0000 mL | INTRAMUSCULAR | Status: AC
  Filled 2017-12-11 (×2): qty 20

## 2017-12-12 ENCOUNTER — Ambulatory Visit (HOSPITAL_COMMUNITY): Payer: Medicare Other | Admitting: Anesthesiology

## 2017-12-12 ENCOUNTER — Ambulatory Visit (HOSPITAL_COMMUNITY): Payer: Medicare Other

## 2017-12-12 ENCOUNTER — Encounter (HOSPITAL_COMMUNITY)
Admission: RE | Disposition: A | Discharge status: Home / Self Care | Payer: Self-pay | Source: Ambulatory Visit | Attending: Orthopedic Surgery

## 2017-12-12 ENCOUNTER — Other Ambulatory Visit: Payer: Self-pay

## 2017-12-12 ENCOUNTER — Observation Stay (HOSPITAL_COMMUNITY): Payer: Medicare Other

## 2017-12-12 ENCOUNTER — Encounter (HOSPITAL_COMMUNITY): Payer: Self-pay | Admitting: Anesthesiology

## 2017-12-12 ENCOUNTER — Observation Stay (HOSPITAL_COMMUNITY)
Admission: RE | Admit: 2017-12-12 | Discharge: 2017-12-13 | Disposition: A | Discharge status: Home / Self Care | Payer: Medicare Other | Source: Ambulatory Visit | Attending: Orthopedic Surgery | Admitting: Orthopedic Surgery

## 2017-12-12 DIAGNOSIS — M48062 Spinal stenosis, lumbar region with neurogenic claudication: Secondary | ICD-10-CM | POA: Diagnosis present

## 2017-12-12 DIAGNOSIS — M48061 Spinal stenosis, lumbar region without neurogenic claudication: Secondary | ICD-10-CM | POA: Diagnosis not present

## 2017-12-12 DIAGNOSIS — Z79899 Other long term (current) drug therapy: Secondary | ICD-10-CM | POA: Diagnosis not present

## 2017-12-12 DIAGNOSIS — F329 Major depressive disorder, single episode, unspecified: Secondary | ICD-10-CM | POA: Insufficient documentation

## 2017-12-12 DIAGNOSIS — Z7982 Long term (current) use of aspirin: Secondary | ICD-10-CM | POA: Diagnosis not present

## 2017-12-12 DIAGNOSIS — Z96641 Presence of right artificial hip joint: Secondary | ICD-10-CM | POA: Insufficient documentation

## 2017-12-12 DIAGNOSIS — M21371 Foot drop, right foot: Secondary | ICD-10-CM | POA: Insufficient documentation

## 2017-12-12 DIAGNOSIS — N400 Enlarged prostate without lower urinary tract symptoms: Secondary | ICD-10-CM | POA: Insufficient documentation

## 2017-12-12 DIAGNOSIS — M549 Dorsalgia, unspecified: Secondary | ICD-10-CM | POA: Diagnosis present

## 2017-12-12 DIAGNOSIS — Z419 Encounter for procedure for purposes other than remedying health state, unspecified: Secondary | ICD-10-CM

## 2017-12-12 HISTORY — PX: LUMBAR LAMINECTOMY/DECOMPRESSION MICRODISCECTOMY: SHX5026

## 2017-12-12 LAB — TYPE AND SCREEN
ABO/RH(D): B POS
Antibody Screen: NEGATIVE

## 2017-12-12 SURGERY — LUMBAR LAMINECTOMY/DECOMPRESSION MICRODISCECTOMY
Anesthesia: General

## 2017-12-12 MED ORDER — HYDROCODONE-ACETAMINOPHEN 10-325 MG PO TABS: 2.0000 | ORAL_TABLET | ORAL | Status: DC | PRN

## 2017-12-12 MED ORDER — FLEET ENEMA 7-19 GM/118ML RE ENEM: 1.0000 | ENEMA | Freq: Once | RECTAL | Status: DC | PRN

## 2017-12-12 MED ORDER — METHOCARBAMOL 500 MG PO TABS: 500.0000 mg | ORAL_TABLET | Freq: Four times a day (QID) | ORAL | Status: DC | PRN

## 2017-12-12 MED ORDER — PROPOFOL 10 MG/ML IV BOLUS
INTRAVENOUS | Status: DC | PRN
  Administered 2017-12-12: 80 mg via INTRAVENOUS

## 2017-12-12 MED ORDER — ONDANSETRON HCL 4 MG/2ML IJ SOLN
INTRAMUSCULAR | Status: DC | PRN
  Administered 2017-12-12: 4 mg via INTRAVENOUS

## 2017-12-12 MED ORDER — ONDANSETRON HCL 4 MG/2ML IJ SOLN
INTRAMUSCULAR | Status: AC
  Filled 2017-12-12: qty 2

## 2017-12-12 MED ORDER — THROMBIN (RECOMBINANT) 5000 UNITS EX SOLR
CUTANEOUS | Status: DC | PRN
  Administered 2017-12-12: 5000 [IU] via TOPICAL

## 2017-12-12 MED ORDER — SODIUM CHLORIDE 0.9 % IV SOLN
INTRAVENOUS | Status: AC
  Filled 2017-12-12: qty 500000

## 2017-12-12 MED ORDER — FENTANYL CITRATE (PF) 250 MCG/5ML IJ SOLN
INTRAMUSCULAR | Status: AC
  Filled 2017-12-12: qty 5

## 2017-12-12 MED ORDER — BACITRACIN-NEOMYCIN-POLYMYXIN 400-5-5000 EX OINT
TOPICAL_OINTMENT | CUTANEOUS | Status: AC
  Filled 2017-12-12: qty 1

## 2017-12-12 MED ORDER — LACTATED RINGERS IV SOLN
INTRAVENOUS | Status: DC
  Administered 2017-12-12 (×2): via INTRAVENOUS

## 2017-12-12 MED ORDER — CEFAZOLIN SODIUM-DEXTROSE 1-4 GM/50ML-% IV SOLN
1.0000 g | Freq: Three times a day (TID) | INTRAVENOUS | Status: AC
  Administered 2017-12-12 – 2017-12-13 (×3): 1 g via INTRAVENOUS
  Filled 2017-12-12 (×3): qty 50

## 2017-12-12 MED ORDER — ACETAMINOPHEN 325 MG PO TABS: 650.0000 mg | ORAL_TABLET | ORAL | Status: DC | PRN

## 2017-12-12 MED ORDER — LIDOCAINE 2% (20 MG/ML) 5 ML SYRINGE
INTRAMUSCULAR | Status: DC | PRN
  Administered 2017-12-12: 100 mg via INTRAVENOUS

## 2017-12-12 MED ORDER — BUPIVACAINE-EPINEPHRINE (PF) 0.5% -1:200000 IJ SOLN
INTRAMUSCULAR | Status: AC
  Filled 2017-12-12: qty 30

## 2017-12-12 MED ORDER — METHOCARBAMOL 1000 MG/10ML IJ SOLN
500.0000 mg | Freq: Four times a day (QID) | INTRAVENOUS | Status: DC | PRN
  Administered 2017-12-12: 500 mg via INTRAVENOUS
  Filled 2017-12-12: qty 550

## 2017-12-12 MED ORDER — DEXAMETHASONE SODIUM PHOSPHATE 10 MG/ML IJ SOLN
INTRAMUSCULAR | Status: AC
  Filled 2017-12-12: qty 1

## 2017-12-12 MED ORDER — HYDROMORPHONE HCL 1 MG/ML IJ SOLN: 0.5000 mg | INTRAMUSCULAR | Status: DC | PRN

## 2017-12-12 MED ORDER — HYDROCODONE-ACETAMINOPHEN 5-325 MG PO TABS
1.0000 | ORAL_TABLET | ORAL | Status: DC | PRN
  Administered 2017-12-12 – 2017-12-13 (×4): 1 via ORAL
  Filled 2017-12-12 (×4): qty 1

## 2017-12-12 MED ORDER — THROMBIN (RECOMBINANT) 5000 UNITS EX SOLR
CUTANEOUS | Status: AC
  Filled 2017-12-12: qty 10000

## 2017-12-12 MED ORDER — ONDANSETRON HCL 4 MG/2ML IJ SOLN: 4.0000 mg | Freq: Four times a day (QID) | INTRAMUSCULAR | Status: DC | PRN

## 2017-12-12 MED ORDER — METHOCARBAMOL 500 MG PO TABS: 500.0000 mg | ORAL_TABLET | Freq: Four times a day (QID) | ORAL | 0 refills | Status: DC | PRN

## 2017-12-12 MED ORDER — LIDOCAINE 2% (20 MG/ML) 5 ML SYRINGE
INTRAMUSCULAR | Status: AC
  Filled 2017-12-12: qty 5

## 2017-12-12 MED ORDER — CHLORHEXIDINE GLUCONATE 4 % EX LIQD: 60.0000 mL | Freq: Once | CUTANEOUS | Status: DC

## 2017-12-12 MED ORDER — DULOXETINE HCL 60 MG PO CPEP
60.0000 mg | ORAL_CAPSULE | Freq: Every day | ORAL | Status: DC
  Administered 2017-12-12: 60 mg via ORAL
  Filled 2017-12-12: qty 1

## 2017-12-12 MED ORDER — CEFAZOLIN SODIUM-DEXTROSE 2-4 GM/100ML-% IV SOLN
2.0000 g | INTRAVENOUS | Status: AC
  Administered 2017-12-12: 2 g via INTRAVENOUS
  Filled 2017-12-12: qty 100

## 2017-12-12 MED ORDER — ONDANSETRON HCL 4 MG PO TABS: 4.0000 mg | ORAL_TABLET | Freq: Four times a day (QID) | ORAL | Status: DC | PRN

## 2017-12-12 MED ORDER — MEPERIDINE HCL 50 MG/ML IJ SOLN: 6.2500 mg | INTRAMUSCULAR | Status: DC | PRN

## 2017-12-12 MED ORDER — LACTATED RINGERS IV SOLN
INTRAVENOUS | Status: DC
  Administered 2017-12-12 (×2): via INTRAVENOUS

## 2017-12-12 MED ORDER — BISACODYL 5 MG PO TBEC: 5.0000 mg | DELAYED_RELEASE_TABLET | Freq: Every day | ORAL | Status: DC | PRN

## 2017-12-12 MED ORDER — PHENYLEPHRINE 40 MCG/ML (10ML) SYRINGE FOR IV PUSH (FOR BLOOD PRESSURE SUPPORT)
PREFILLED_SYRINGE | INTRAVENOUS | Status: DC | PRN
  Administered 2017-12-12 (×3): 80 ug via INTRAVENOUS

## 2017-12-12 MED ORDER — HYDROMORPHONE HCL 1 MG/ML IJ SOLN: 0.2500 mg | INTRAMUSCULAR | Status: DC | PRN

## 2017-12-12 MED ORDER — MIDAZOLAM HCL 2 MG/2ML IJ SOLN
INTRAMUSCULAR | Status: DC | PRN
  Administered 2017-12-12: 2 mg via INTRAVENOUS

## 2017-12-12 MED ORDER — BUPIVACAINE LIPOSOME 1.3 % IJ SUSP
INTRAMUSCULAR | Status: DC | PRN
  Administered 2017-12-12: 20 mL

## 2017-12-12 MED ORDER — BUPIVACAINE-EPINEPHRINE (PF) 0.5% -1:200000 IJ SOLN
INTRAMUSCULAR | Status: DC | PRN
  Administered 2017-12-12: 30 mL via PERINEURAL

## 2017-12-12 MED ORDER — ONDANSETRON HCL 4 MG/2ML IJ SOLN: 4.0000 mg | Freq: Once | INTRAMUSCULAR | Status: DC | PRN

## 2017-12-12 MED ORDER — TAMSULOSIN HCL 0.4 MG PO CAPS
0.4000 mg | ORAL_CAPSULE | Freq: Every day | ORAL | Status: DC
  Administered 2017-12-12: 0.4 mg via ORAL
  Filled 2017-12-12: qty 1

## 2017-12-12 MED ORDER — BUPROPION HCL ER (XL) 150 MG PO TB24
150.0000 mg | ORAL_TABLET | Freq: Every day | ORAL | Status: DC
  Administered 2017-12-13: 150 mg via ORAL
  Filled 2017-12-12: qty 1

## 2017-12-12 MED ORDER — SUCCINYLCHOLINE CHLORIDE 200 MG/10ML IV SOSY
PREFILLED_SYRINGE | INTRAVENOUS | Status: AC
  Filled 2017-12-12: qty 10

## 2017-12-12 MED ORDER — HYDROCODONE-ACETAMINOPHEN 5-325 MG PO TABS: 1.0000 | ORAL_TABLET | ORAL | 0 refills | Status: DC | PRN

## 2017-12-12 MED ORDER — PHENOL 1.4 % MT LIQD
1.0000 | OROMUCOSAL | Status: DC | PRN
  Filled 2017-12-12: qty 177

## 2017-12-12 MED ORDER — DEXTROSE 5 % IV SOLN
INTRAVENOUS | Status: DC | PRN
  Administered 2017-12-12: 50 ug/min via INTRAVENOUS

## 2017-12-12 MED ORDER — MIDAZOLAM HCL 2 MG/2ML IJ SOLN
INTRAMUSCULAR | Status: AC
  Filled 2017-12-12: qty 2

## 2017-12-12 MED ORDER — ROCURONIUM BROMIDE 10 MG/ML (PF) SYRINGE
PREFILLED_SYRINGE | INTRAVENOUS | Status: AC
  Filled 2017-12-12: qty 5

## 2017-12-12 MED ORDER — SUGAMMADEX SODIUM 200 MG/2ML IV SOLN
INTRAVENOUS | Status: DC | PRN
  Administered 2017-12-12: 300 mg via INTRAVENOUS

## 2017-12-12 MED ORDER — ACETAMINOPHEN 650 MG RE SUPP: 650.0000 mg | RECTAL | Status: DC | PRN

## 2017-12-12 MED ORDER — ARTIFICIAL TEARS OPHTHALMIC OINT
TOPICAL_OINTMENT | OPHTHALMIC | Status: DC | PRN
  Administered 2017-12-12: 1 via OPHTHALMIC

## 2017-12-12 MED ORDER — LATANOPROST 0.005 % OP SOLN
1.0000 [drp] | Freq: Every day | OPHTHALMIC | Status: DC
  Administered 2017-12-12: 1 [drp] via OPHTHALMIC
  Filled 2017-12-12: qty 2.5

## 2017-12-12 MED ORDER — ROCURONIUM BROMIDE 10 MG/ML (PF) SYRINGE
PREFILLED_SYRINGE | INTRAVENOUS | Status: DC | PRN
  Administered 2017-12-12: 50 mg via INTRAVENOUS
  Administered 2017-12-12 (×2): 10 mg via INTRAVENOUS

## 2017-12-12 MED ORDER — SUGAMMADEX SODIUM 500 MG/5ML IV SOLN
INTRAVENOUS | Status: AC
  Filled 2017-12-12: qty 5

## 2017-12-12 MED ORDER — PROPOFOL 10 MG/ML IV BOLUS
INTRAVENOUS | Status: AC
  Filled 2017-12-12: qty 20

## 2017-12-12 MED ORDER — FENTANYL CITRATE (PF) 250 MCG/5ML IJ SOLN
INTRAMUSCULAR | Status: DC | PRN
  Administered 2017-12-12: 50 ug via INTRAVENOUS
  Administered 2017-12-12: 150 ug via INTRAVENOUS
  Administered 2017-12-12: 50 ug via INTRAVENOUS

## 2017-12-12 MED ORDER — MENTHOL 3 MG MT LOZG: 1.0000 | LOZENGE | OROMUCOSAL | Status: DC | PRN

## 2017-12-12 MED ORDER — DEXAMETHASONE SODIUM PHOSPHATE 10 MG/ML IJ SOLN
INTRAMUSCULAR | Status: DC | PRN
  Administered 2017-12-12: 10 mg via INTRAVENOUS

## 2017-12-12 MED ORDER — POLYETHYLENE GLYCOL 3350 17 G PO PACK: 17.0000 g | PACK | Freq: Every day | ORAL | Status: DC | PRN

## 2017-12-12 MED ORDER — SODIUM CHLORIDE 0.9 % IV SOLN
INTRAVENOUS | Status: DC | PRN
  Administered 2017-12-12: 500 mL

## 2017-12-12 SURGICAL SUPPLY — 51 items
AGENT HMST SPONGE THK3/8 (HEMOSTASIS) ×1
APL SKNCLS STERI-STRIP NONHPOA (GAUZE/BANDAGES/DRESSINGS) ×1
BAG SPEC THK2 15X12 ZIP CLS (MISCELLANEOUS)
BAG ZIPLOCK 12X15 (MISCELLANEOUS) IMPLANT
BENZOIN TINCTURE PRP APPL 2/3 (GAUZE/BANDAGES/DRESSINGS) ×3 IMPLANT
CLEANER TIP ELECTROSURG 2X2 (MISCELLANEOUS) ×3 IMPLANT
COVER SURGICAL LIGHT HANDLE (MISCELLANEOUS) ×3 IMPLANT
DRAIN PENROSE 18X1/4 LTX STRL (WOUND CARE) IMPLANT
DRAPE MICROSCOPE LEICA (MISCELLANEOUS) ×3 IMPLANT
DRAPE POUCH INSTRU U-SHP 10X18 (DRAPES) ×3 IMPLANT
DRAPE SHEET LG 3/4 BI-LAMINATE (DRAPES) ×3 IMPLANT
DRAPE SURG 17X11 SM STRL (DRAPES) ×3 IMPLANT
DRSG ADAPTIC 3X8 NADH LF (GAUZE/BANDAGES/DRESSINGS) ×3 IMPLANT
DRSG PAD ABDOMINAL 8X10 ST (GAUZE/BANDAGES/DRESSINGS) ×12 IMPLANT
DURAPREP 26ML APPLICATOR (WOUND CARE) ×3 IMPLANT
ELECT BLADE TIP CTD 4 INCH (ELECTRODE) ×3 IMPLANT
ELECT REM PT RETURN 15FT ADLT (MISCELLANEOUS) ×3 IMPLANT
GAUZE SPONGE 4X4 12PLY STRL (GAUZE/BANDAGES/DRESSINGS) ×3 IMPLANT
GAUZE XEROFORM 1X8 LF (GAUZE/BANDAGES/DRESSINGS) ×2 IMPLANT
GLOVE BIOGEL PI IND STRL 6.5 (GLOVE) ×1 IMPLANT
GLOVE BIOGEL PI IND STRL 8.5 (GLOVE) ×1 IMPLANT
GLOVE BIOGEL PI INDICATOR 6.5 (GLOVE) ×2
GLOVE BIOGEL PI INDICATOR 8.5 (GLOVE) ×2
GLOVE ECLIPSE 8.0 STRL XLNG CF (GLOVE) ×6 IMPLANT
GLOVE SURG SS PI 6.5 STRL IVOR (GLOVE) ×3 IMPLANT
GOWN STRL REUS W/ TWL LRG LVL3 (GOWN DISPOSABLE) ×1 IMPLANT
GOWN STRL REUS W/TWL LRG LVL3 (GOWN DISPOSABLE) ×3
GOWN STRL REUS W/TWL XL LVL3 (GOWN DISPOSABLE) ×6 IMPLANT
HEMOSTAT SPONGE AVITENE ULTRA (HEMOSTASIS) ×3 IMPLANT
KIT BASIN OR (CUSTOM PROCEDURE TRAY) ×3 IMPLANT
KIT POSITIONING SURG ANDREWS (MISCELLANEOUS) ×3 IMPLANT
MANIFOLD NEPTUNE II (INSTRUMENTS) ×3 IMPLANT
MARKER SKIN DUAL TIP RULER LAB (MISCELLANEOUS) ×3 IMPLANT
NDL SPNL 18GX3.5 QUINCKE PK (NEEDLE) ×2 IMPLANT
NEEDLE HYPO 22GX1.5 SAFETY (NEEDLE) ×6 IMPLANT
NEEDLE SPNL 18GX3.5 QUINCKE PK (NEEDLE) ×6 IMPLANT
PACK LAMINECTOMY ORTHO (CUSTOM PROCEDURE TRAY) ×3 IMPLANT
PAD ABD 8X10 STRL (GAUZE/BANDAGES/DRESSINGS) ×2 IMPLANT
PATTIES SURGICAL .5 X.5 (GAUZE/BANDAGES/DRESSINGS) IMPLANT
PATTIES SURGICAL .75X.75 (GAUZE/BANDAGES/DRESSINGS) ×3 IMPLANT
PATTIES SURGICAL 1X1 (DISPOSABLE) IMPLANT
PIN SAFETY NICK PLATE  2 MED (MISCELLANEOUS)
PIN SAFETY NICK PLATE 2 MED (MISCELLANEOUS) IMPLANT
SPONGE LAP 4X18 X RAY DECT (DISPOSABLE) ×6 IMPLANT
STAPLER VISISTAT 35W (STAPLE) ×3 IMPLANT
SUT VIC AB 1 CT1 27 (SUTURE) ×6
SUT VIC AB 1 CT1 27XBRD ANTBC (SUTURE) ×2 IMPLANT
SUT VIC AB 2-0 CT1 27 (SUTURE) ×6
SUT VIC AB 2-0 CT1 TAPERPNT 27 (SUTURE) ×2 IMPLANT
SYR 20CC LL (SYRINGE) ×6 IMPLANT
TOWEL OR 17X26 10 PK STRL BLUE (TOWEL DISPOSABLE) ×3 IMPLANT

## 2017-12-12 NOTE — Anesthesia Preprocedure Evaluation (Signed)
Anesthesia Evaluation  Patient identified by MRN, date of birth, ID band Patient awake    Reviewed: Allergy & Precautions, NPO status , Patient's Chart, lab work & pertinent test results  Airway Mallampati: I  TM Distance: >3 FB Neck ROM: Full    Dental   Pulmonary    Pulmonary exam normal        Cardiovascular Normal cardiovascular exam     Neuro/Psych Depression    GI/Hepatic   Endo/Other    Renal/GU      Musculoskeletal   Abdominal   Peds  Hematology   Anesthesia Other Findings   Reproductive/Obstetrics                             Anesthesia Physical Anesthesia Plan  ASA: II  Anesthesia Plan: General   Post-op Pain Management:    Induction: Intravenous  PONV Risk Score and Plan: 2 and Ondansetron, Midazolam and Treatment may vary due to age or medical condition  Airway Management Planned: LMA  Additional Equipment:   Intra-op Plan:   Post-operative Plan: Extubation in OR  Informed Consent: I have reviewed the patients History and Physical, chart, labs and discussed the procedure including the risks, benefits and alternatives for the proposed anesthesia with the patient or authorized representative who has indicated his/her understanding and acceptance.     Plan Discussed with: CRNA and Surgeon  Anesthesia Plan Comments:         Anesthesia Quick Evaluation

## 2017-12-12 NOTE — Anesthesia Postprocedure Evaluation (Signed)
Anesthesia Post Note  Patient: [name]  Procedure(s) Performed: Decompressive lumbar laminectomy L4-L5 for stenosis (N/A )     Patient location during evaluation: PACU Anesthesia Type: General Level of consciousness: awake and alert Pain management: pain level controlled Vital Signs Assessment: post-procedure vital signs reviewed and stable Respiratory status: spontaneous breathing, nonlabored ventilation, respiratory function stable and patient connected to nasal cannula oxygen Cardiovascular status: blood pressure returned to baseline and stable Postop Assessment: no apparent nausea or vomiting Anesthetic complications: no    Last Vitals:  Vitals:   12/12/17 1356 12/12/17 1518  BP: (!) 141/76 (!) 141/71  Pulse: 75 81  Resp: 14 14  Temp: 36.7 C 36.7 C  SpO2: 94%     Last Pain:  Vitals:   12/12/17 1518  TempSrc: Oral  PainSc:                  [name] [name]

## 2017-12-12 NOTE — Plan of Care (Signed)
Plan of care discussed.   

## 2017-12-12 NOTE — Interval H&P Note (Signed)
History and Physical Interval Note:  12/12/2017 8:22 AM  [name]  has presented today for surgery, with the diagnosis of Spinal stenosis L4-L5  The various methods of treatment have been discussed with the patient and family. After consideration of risks, benefits and other options for treatment, the patient has consented to  Procedure(s): Decompressive lumbar laminectomy L4-L5 for stenosis (N/A) as a surgical intervention .  The patient's history has been reviewed, patient examined, no change in status, stable for surgery.  I have reviewed the patient's chart and labs.  Questions were answered to the patient's satisfaction.     Ranee Gosselinonald Jettie Mannor

## 2017-12-12 NOTE — Discharge Instructions (Signed)

## 2017-12-12 NOTE — Anesthesia Procedure Notes (Signed)
Procedure Name: Intubation Date/Time: 12/12/2017 8:41 AM Performed by: Florene Routeeardon, Naaman Curro L, CRNA Patient Re-evaluated:Patient Re-evaluated prior to induction Oxygen Delivery Method: Circle system utilized Preoxygenation: Pre-oxygenation with 100% oxygen Induction Type: IV induction Ventilation: Mask ventilation without difficulty and Oral airway inserted - appropriate to patient size Laryngoscope Size: Miller and 3 Grade View: Grade I Tube type: Oral Tube size: 8.0 mm Number of attempts: 1 Airway Equipment and Method: Stylet Placement Confirmation: ETT inserted through vocal cords under direct vision,  positive ETCO2 and breath sounds checked- equal and bilateral Secured at: 22 cm Tube secured with: Tape Dental Injury: Teeth and Oropharynx as per pre-operative assessment

## 2017-12-12 NOTE — Brief Op Note (Signed)
12/12/2017  10:47 AM  PATIENT:  [name]  72 y.o. 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  POST-OPERATIVE DIAGNOSIS:  Same as Pre-Op  PROCEDURE:  Procedure(s): Decompressive lumbar laminectomy L4-L5 for stenosis (N/A) and Foraminotomies for the L-4 and L-5 nerve roots.  SURGEON:  Surgeon(s) and Role:    * Ranee GosselinGioffre, Kellin Bartling, MD - Primary  PHYSICIAN ASSISTANT: Dimitri PedAmber Constable PA  ASSISTANTS: Dimitri PedAmber Constable PA  ANESTHESIA:   general  EBL:  200 mL   BLOOD ADMINISTERED:none  DRAINS: none   LOCAL MEDICATIONS USED:  MARCAINE 20cc of 0.50% with Epinephrine at start of the case and 20cc of Exparel at the end of the case.    SPECIMEN:  No Specimen  DISPOSITION OF SPECIMEN:  N/A  COUNTS:  YES  TOURNIQUET:  * No tourniquets in log *  DICTATION: .Other Dictation: Dictation Number [id number]  PLAN OF CARE: Admit for overnight observation  PATIENT DISPOSITION:  PACU - hemodynamically stable.   Delay start of Pharmacological VTE agent (>24hrs) due to surgical blood loss or risk of bleeding: yes

## 2017-12-12 NOTE — Transfer of Care (Signed)
Immediate Anesthesia Transfer of Care Note  Patient: [name]  Procedure(s) Performed: Decompressive lumbar laminectomy L4-L5 for stenosis (N/A )  Patient Location: PACU  Anesthesia Type:General  Level of Consciousness: awake  Airway & Oxygen Therapy: Patient Spontanous Breathing and Patient connected to face mask oxygen  Post-op Assessment: Report given to RN and Post -op Vital signs reviewed and stable  Post vital signs: Reviewed and stable  Last Vitals:  Vitals Value Taken Time  BP    Temp    Pulse 92 12/12/2017 11:03 AM  Resp 19 12/12/2017 11:03 AM  SpO2 99 % 12/12/2017 11:03 AM  Vitals shown include unvalidated device data.  Last Pain:  Vitals:   12/12/17 0709  TempSrc:   PainSc: 0-No pain         Complications: No apparent anesthesia complications

## 2017-12-12 NOTE — H&P (Signed)
[name] is an 72 y.o. male.   Chief Complaint: Back and Right Leg Pain HPI: He has developed progressive Low back Pain and Right Sciatic type Pain.  Past Medical History:  Diagnosis Date  . Arthritis    mild lower back  . BPH (benign prostatic hyperplasia)   . Depression   . Dyspnea    with exertion  . Lumbar spinal stenosis   . Pelvic injury 2015   both sides    Past Surgical History:  Procedure Laterality Date  . colonscopy     every 5 yrs  . JOINT REPLACEMENT  12-23-12   right hip replaced  . left carpal tunnel surgery  yrs ago  . SHOULDER ARTHROSCOPY WITH ROTATOR CUFF REPAIR AND SUBACROMIAL DECOMPRESSION  12-23-12   Right/ left shoulder(microscopic surgery)  . SHOULDER OPEN ROTATOR CUFF REPAIR Left 01/01/2013   Procedure: OPEN ANTERIOR ACROMINECTOMY/ROTATOR CUFF REPAIR/DISTAL CLAVICLE RESECTION  LEFT SHOULDER;  Surgeon: Drucilla SchmidtJames P Aplington, MD;  Location: [facility];  Service: Orthopedics;  Laterality: Left;  . TONSILLECTOMY     child    History reviewed. No pertinent family history. Social History:  reports that he has never smoked. He has never used smokeless tobacco. He reports that he does not drink alcohol or use drugs.  Allergies: No Known Allergies  Medications Prior to Admission  Medication Sig Dispense Refill  . aspirin EC 81 MG tablet Take 81 mg by mouth at bedtime.    Marland Kitchen. atorvastatin (LIPITOR) 40 MG tablet Take 40 mg by mouth at bedtime.     Marland Kitchen. buPROPion (WELLBUTRIN XL) 150 MG 24 hr tablet Take 150 mg by mouth daily.  0  . DULoxetine (CYMBALTA) 60 MG capsule Take 60 mg by mouth at bedtime.     Marland Kitchen. latanoprost (XALATAN) 0.005 % ophthalmic solution Place 1 drop into both eyes at bedtime.    . Multiple Vitamin (MULTIVITAMIN WITH MINERALS) TABS Take 1 tablet by mouth daily.    . Omega-3 Fatty Acids (FISH OIL) 1200 MG CAPS Take 1,200 mg by mouth at bedtime.    . tamsulosin (FLOMAX) 0.4 MG CAPS capsule Take 0.4 mg by mouth at bedtime.     . Cyanocobalamin 1500 MCG  TBDP Take 1,500 mcg by mouth daily. VITAMIN B-12    . naproxen sodium (ALEVE) 220 MG tablet Take 220 mg by mouth 2 (two) times daily as needed (FOR PAIN.).      No results found for this or any previous visit (from the past 48 hour(s)). No results found.  Review of Systems  Constitutional: Negative.   HENT: Negative.   Eyes: Negative.   Cardiovascular: Negative.   Gastrointestinal: Negative.   Genitourinary: Negative.   Musculoskeletal: Positive for back pain.  Skin: Negative.   Neurological: Positive for weakness.  Endo/Heme/Allergies: Negative.   Psychiatric/Behavioral: Negative.     Blood pressure (!) 155/82, pulse 70, temperature 98.2 F (36.8 C), temperature source Oral, resp. rate 18, height 5\' 10"  (1.778 m), weight 109.8 kg (242 lb), SpO2 94 %. Physical Exam  Constitutional: He appears well-developed.  HENT:  Head: Normocephalic.  Eyes: Pupils are equal, round, and reactive to light.  Neck: Normal range of motion.  Cardiovascular: Normal rate.  Respiratory: Effort normal.  GI: Soft.  Musculoskeletal: He exhibits tenderness.  Neurological:  WEakness of Right foot Dorsiflexors on the Right with a Positive Straight Leg Raising.     Assessment/Plan Decompressive Lumbar Laminectomy and possible Microdiscectomy at L-4-L-5 on the right.  Ranee Gosselinonald Channin Agustin, MD 12/12/2017,  8:17 AM

## 2017-12-12 NOTE — Op Note (Signed)
NAMLamount Cohen:  Walts, Erastus              ACCOUNT NO.:  [id number]  MEDICAL RECORD NO.:  [id number]  LOCATION:  [facility]                         [facility]  PHYSICIAN:  Georges Lynchonald A. Lorette Peterkin, M.D.DATE OF BIRTH:  Sep 14, 1945  DATE OF PROCEDURE:  12/12/2017 DATE OF DISCHARGE:                              OPERATIVE REPORT   SURGEON:  Georges Lynchonald A. Darrelyn HillockGioffre, M.D.  OPERATIVE ASSISTANT:  Dimitri PedAmber Constable, PA.  PREOPERATIVE DIAGNOSES: 1. Severe spinal stenosis with just about a complete block at L4-L5. 2. Foraminal stenosis involving the L4 root on the right. 3. Foraminal stenosis involving the L5 root on the right. 4. Partial footdrop on the right.  All his symptoms were in the right     lower extremity.  POSTOPERATIVE DIAGNOSES: 1. Severe spinal stenosis with just about a complete block at L4-L5. 2. Foraminal stenosis involving the L4 root on the right. 3. Foraminal stenosis involving the L5 root on the right. 4. Partial footdrop on the right.  All his symptoms were in the right     lower extremity.  OPERATIONS: 1. Complete decompressive lumbar laminectomy at L4-L5. 2. Decompression of the lateral recesses as well. 3. Foraminotomies for the L4 root and the L5 root on the right.  DESCRIPTION OF PROCEDURE:  Under general anesthesia with the patient on a spinal frame, a routine orthopedic prep and draping of the lower back was carried out.  The appropriate time-out was carried out.  I also marked the right side of his back in the holding area.  At this time, he had 2 g of IV Ancef.  Two needles then were placed in the back for localization purposes and an x-ray was taken.  Incision then was made over the L4-5 interspace and extended proximally and distally in the usual fashion.  I then inserted self-retaining retractors.  I then stripped the muscle from the lamina and spinous processes bilaterally. Another x-ray was taken with a Kocher clamp in place.  Following that, I began my central  decompressive lumbar laminectomy.  Great care was taken not to injure the dura.  The microscope was brought in.  When I was down to the ligamentum flavum, another x-ray was taken to verify position. At this point, by use of the microscope, I then gently teased the ligamentum flavum off the dura.  It was quite thickened.  I utilized some cottonoid between the dura and the ligamentum flavum and then did a complete removal of the ligamentum flavum.  I then went out and decompressed the lateral recesses.  I went distally and proximally as far as I needed to go in order to decompress the dural sac.  We then did foraminotomies for the L4 and the L5 root.  I then gently retracted the dura and the L5 root to explore the disk space.  It was rather very narrow and there was no herniated disk.  The defect was all based on the fact of the recess stenosis.  I thoroughly irrigated out the area.  I utilized a hockey-stick, went out into the foramina, and made sure we were free on both sides.  I then thoroughly irrigated the wound, loosely applied some thrombin-soaked Gelfoam, closed the wound  in layers in the usual fashion except I left a small distal deep and proximal part of the wound open for drainage purposes.  Remaining part of the wound was closed in the usual fashion.  At the beginning of the surgery, I injected 20 mL of 0.5% Marcaine with epinephrine to control soft tissue bleeding.  At the end of the procedure, I injected 20 mL of Exparel into the soft tissue. The wound then was closed with staples.  Sterile dressings were applied. The patient left the operating room in satisfactory condition.  He will be kept overnight for pain control.          ______________________________ Georges Lynch. Darrelyn Hillock, M.D.     RAG/MEDQ  D:  12/12/2017  T:  12/12/2017  Job:  [id number]

## 2017-12-12 NOTE — Evaluation (Signed)
Physical Therapy Evaluation Patient Details Name: [name] MRN: [id number] DOB: 1945/11/22 Today's Date: 12/12/2017   History of Present Illness  Patient is a 72 y/o male with PMH of Arthritis, pelvic injury, BPH and THA, admitted due to Spinal stenosis L4-L5 and Foraminal Stenosis involving the L-4 and L-5 nerve roots on the Right; now s/p decompressive Lumbar Laminectomy L4-L5.  Clinical Impression  Patient presents with decreased mobility due to deficits listed in PT problem list.  He will benefit from skilled PT in the acute setting and likely be able to d/c home with family support without follow up PT.      Follow Up Recommendations No PT follow up    Equipment Recommendations  None recommended by PT    Recommendations for Other Services       Precautions / Restrictions Precautions Precautions: Back;Fall Precaution Comments: reviewed BLT precautions with patient and written on board in room      Mobility  Bed Mobility Overal bed mobility: Needs Assistance Bed Mobility: Rolling;Sidelying to Sit Rolling: Supervision Sidelying to sit: Supervision       General bed mobility comments: cues for technique  Transfers Overall transfer level: Needs assistance Equipment used: Rolling walker (2 wheeled) Transfers: Sit to/from Stand Sit to Stand: Min guard         General transfer comment: cues for technique/hand placement  Ambulation/Gait Ambulation/Gait assistance: Supervision;Min guard Ambulation Distance (Feet): 70 Feet Assistive device: Rolling walker (2 wheeled) Gait Pattern/deviations: Step-through pattern;Decreased stride length;Wide base of support     General Gait Details: slow, stiff and slightly tremulous  Stairs            Wheelchair Mobility    Modified Rankin (Stroke Patients Only)       Balance Overall balance assessment: Needs assistance   Sitting balance-Leahy Scale: Good       Standing balance-Leahy Scale: Fair                                Pertinent Vitals/Pain Pain Assessment: 0-10 Pain Score: 3  Pain Location: incision Pain Descriptors / Indicators: Operative site guarding Pain Intervention(s): Monitored during session;Repositioned    Home Living Family/patient expects to be discharged to:: Private residence Living Arrangements: Spouse/significant other Available Help at Discharge: Family Type of Home: House Home Access: Ramped entrance     Home Layout: Multi-level;Able to live on main level with bedroom/bathroom(split level) Home Equipment: Walker - 2 wheels;Cane - single point Additional Comments: used cane PTA    Prior Function Level of Independence: Independent with assistive device(s)         Comments: wife had to help with socks     Hand Dominance        Extremity/Trunk Assessment        Lower Extremity Assessment Lower Extremity Assessment: Generalized weakness    Cervical / Trunk Assessment Cervical / Trunk Assessment: Other exceptions Cervical / Trunk Exceptions: s/p back surgery, cues for precautions  Communication   Communication: No difficulties  Cognition Arousal/Alertness: Awake/alert Behavior During Therapy: WFL for tasks assessed/performed Overall Cognitive Status: Within Functional Limits for tasks assessed                                        General Comments General comments (skin integrity, edema, etc.): wife present throughout session    Exercises  Assessment/Plan    PT Assessment Patient needs continued PT services  PT Problem List Decreased mobility;Decreased knowledge of precautions;Decreased activity tolerance;Decreased balance;Decreased knowledge of use of DME       PT Treatment Interventions Therapeutic activities;DME instruction;Gait training;Therapeutic exercise;Patient/family education;Balance training;Functional mobility training    PT Goals (Current goals can be found in the Care Plan section)   Acute Rehab PT Goals Patient Stated Goal: To return home tomorrow PT Goal Formulation: With patient/family Time For Goal Achievement: 12/14/17 Potential to Achieve Goals: Good    Frequency Min 5X/week   Barriers to discharge        Co-evaluation               AM-PAC PT "6 Clicks" Daily Activity  Outcome Measure Difficulty turning over in bed (including adjusting bedclothes, sheets and blankets)?: A Little Difficulty moving from lying on back to sitting on the side of the bed? : A Lot Difficulty sitting down on and standing up from a chair with arms (e.g., wheelchair, bedside commode, etc,.)?: Unable Help needed moving to and from a bed to chair (including a wheelchair)?: A Little Help needed walking in hospital room?: A Little Help needed climbing 3-5 steps with a railing? : A Lot 6 Click Score: 14    End of Session Equipment Utilized During Treatment: Gait belt Activity Tolerance: Patient tolerated treatment well Patient left: in chair;with chair alarm set;with call bell/phone within reach;with family/visitor present   PT Visit Diagnosis: Other abnormalities of gait and mobility (R26.89)    Time: 4098-11911445-1513 PT Time Calculation (min) (ACUTE ONLY): 28 min   Charges:   PT Evaluation $PT Eval Low Complexity: 1 Low PT Treatments $Gait Training: 8-22 mins   PT G CodesSheran Lawless:        Cyndi Nanie Dunkleberger, [place] CarolinaPT [phone] 12/12/2017   Elray Mcgregorynthia Mikeila Burgen 12/12/2017, 4:51 PM

## 2017-12-13 DIAGNOSIS — M48061 Spinal stenosis, lumbar region without neurogenic claudication: Secondary | ICD-10-CM | POA: Diagnosis not present

## 2017-12-13 NOTE — Progress Notes (Signed)
Subjective: 1 Day Post-Op Procedure(s) (LRB): Decompressive lumbar laminectomy L4-L5 for stenosis (N/A) Patient reports pain as 2 on 0-10 scale. No pain today. Will Dc today.   Objective: Vital signs in last 24 hours: Temp:  [97.6 F (36.4 C)-98.8 F (37.1 C)] 98.2 F (36.8 C) (04/18 0506) Pulse Rate:  [75-92] 81 (04/18 0506) Resp:  [12-23] 16 (04/17 1710) BP: (111-145)/(43-89) 145/78 (04/18 0506) SpO2:  [89 %-100 %] 97 % (04/18 0506)  Intake/Output from previous day: 04/17 0701 - 04/18 0700 In: 5223.3 [P.O.:1240; I.V.:3728.3; IV Piggyback:255] Out: 1350 [Urine:1150; Blood:200] Intake/Output this shift: No intake/output data recorded.  No results for input(s): HGB in the last 72 hours. No results for input(s): WBC, RBC, HCT, PLT in the last 72 hours. No results for input(s): NA, K, CL, CO2, BUN, CREATININE, GLUCOSE, CALCIUM in the last 72 hours. No results for input(s): LABPT, INR in the last 72 hours.  Neurologically intact Dorsiflexion/Plantar flexion intact    Assessment/Plan: 1 Day Post-Op Procedure(s) (LRB): Decompressive lumbar laminectomy L4-L5 for stenosis (N/A) Up with therapy.    Ranee GosselinRonald Ladashia Demarinis 12/13/2017, 7:19 AM

## 2017-12-13 NOTE — Evaluation (Signed)
Occupational Therapy Evaluation Patient Details Name: Council MechanicBurton L Leaming MRN: [id number] DOB: March 29, 1946 Today's Date: 12/13/2017    History of Present Illness Patient is a 72 y/o male with PMH of Arthritis, pelvic injury, BPH and THA, admitted due to Spinal stenosis L4-L5 and Foraminal Stenosis involving the L-4 and L-5 nerve roots on the Right; now s/p decompressive Lumbar Laminectomy L4-L5.   Clinical Impression   OT eval complete. OT education complete regarding back precautions    Follow Up Recommendations  No OT follow up    Equipment Recommendations  None recommended by OT    Recommendations for Other Services       Precautions / Restrictions Precautions Precautions: Back;Fall Precaution Comments: reviewed BLT precautions with patient and issued handout Restrictions Weight Bearing Restrictions: No      Mobility Bed Mobility Overal bed mobility: Needs Assistance Bed Mobility: Rolling;Sidelying to Sit Rolling: Supervision Sidelying to sit: Supervision       General bed mobility comments: cues for technique  Transfers Overall transfer level: Needs assistance Equipment used: Rolling walker (2 wheeled) Transfers: Sit to/from Stand Sit to Stand: Min guard         General transfer comment: cues for technique/hand placement    Balance Overall balance assessment: Needs assistance   Sitting balance-Leahy Scale: Good       Standing balance-Leahy Scale: Fair                             ADL either performed or assessed with clinical judgement   ADL Overall ADL's : Needs assistance/impaired Eating/Feeding: Set up;Sitting   Grooming: Standing;Supervision/safety   Upper Body Bathing: Set up;Sitting   Lower Body Bathing: Minimal assistance;Sit to/from stand;With caregiver independent assisting;Cueing for sequencing   Upper Body Dressing : Set up;Sitting   Lower Body Dressing: Minimal assistance;Sit to/from stand;Cueing for sequencing;Cueing  for safety;With adaptive equipment;With caregiver independent assisting   Toilet Transfer: Supervision/safety;Comfort height toilet   Toileting- Clothing Manipulation and Hygiene: Minimal assistance;With caregiver independent assisting;Cueing for back precautions     Tub/Shower Transfer Details (indicate cue type and reason): verbalized safety   General ADL Comments: wife will A as needed     Vision Patient Visual Report: No change from baseline              Pertinent Vitals/Pain Pain Score: 2  Pain Location: incision Pain Descriptors / Indicators: Sore Pain Intervention(s): Monitored during session     Hand Dominance     Extremity/Trunk Assessment Upper Extremity Assessment Upper Extremity Assessment: Generalized weakness           Communication Communication Communication: No difficulties   Cognition Arousal/Alertness: Awake/alert Behavior During Therapy: WFL for tasks assessed/performed Overall Cognitive Status: Within Functional Limits for tasks assessed                                                Home Living Family/patient expects to be discharged to:: Private residence Living Arrangements: Spouse/significant other Available Help at Discharge: Family Type of Home: House Home Access: Ramped entrance     Home Layout: Multi-level;Able to live on main level with bedroom/bathroom(split level)     Bathroom Shower/Tub: Producer, television/film/videoWalk-in shower   Bathroom Toilet: Handicapped height Bathroom Accessibility: Yes   Home Equipment: Environmental consultantWalker - 2 wheels;Cane - single point   Additional Comments: used cane  PTA      Prior Functioning/Environment Level of Independence: Independent with assistive device(s)        Comments: wife had to help with socks                 OT Goals(Current goals can be found in the care plan section) Acute Rehab OT Goals 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  OT Frequency:     Barriers to D/C:                AM-PAC PT "6 Clicks" Daily Activity     Outcome Measure Help from another person eating meals?: None Help from another person taking care of personal grooming?: None Help from another person toileting, which includes using toliet, bedpan, or urinal?: A Little Help from another person bathing (including washing, rinsing, drying)?: A Little Help from another person to put on and taking off regular upper body clothing?: None Help from another person to put on and taking off regular lower body clothing?: A Little 6 Click Score: 21   End of Session Equipment Utilized During Treatment: Rolling walker Nurse Communication: Mobility status  Activity Tolerance: Patient tolerated treatment well Patient left: in chair;with call bell/phone within reach;with family/visitor present                   Time: 1610-9604 OT Time Calculation (min): 18 min Charges:  OT General Charges $OT Visit: 1 Visit OT Evaluation $OT Eval Moderate Complexity: 1 Mod G-Codes:     Lise Auer, OT [phone]  Einar Crow D 12/13/2017, 10:25 AM

## 2017-12-13 NOTE — Progress Notes (Signed)
Physical Therapy Treatment Patient Details Name: [name] MRN: [id number] DOB: 1945/10/02 Today's Date: 12/13/2017    History of Present Illness Patient is a 72 y/o male with PMH of Arthritis, pelvic injury, BPH and THA, admitted due to Spinal stenosis L4-L5 and Foraminal Stenosis involving the L-4 and L-5 nerve roots on the Right; now s/p decompressive Lumbar Laminectomy L4-L5.    PT Comments    Pt progressing well with mobility, he ambulated 200' with RW, no loss of balance. Reviewed back precautions and encouraged frequent ambulation. Pt is ready to DC home from PT standpoint.   Follow Up Recommendations  No PT follow up     Equipment Recommendations  None recommended by PT    Recommendations for Other Services       Precautions / Restrictions Precautions Precautions: Back;Fall Precaution Comments: reviewed BLT precautions with patient and issued handout Restrictions Weight Bearing Restrictions: No    Mobility  Bed Mobility               General bed mobility comments: up in recliner, verbally reviewed log roll technique  Transfers Overall transfer level: Needs assistance Equipment used: Rolling walker (2 wheeled) Transfers: Sit to/from Stand Sit to Stand: Supervision         General transfer comment: cues for technique/hand placement  Ambulation/Gait Ambulation/Gait assistance: Supervision Ambulation Distance (Feet): 200 Feet Assistive device: Rolling walker (2 wheeled) Gait Pattern/deviations: Step-through pattern;Decreased stride length;Wide base of support Gait velocity: decr   General Gait Details: slow and stiff, no loss of balance   Stairs             Wheelchair Mobility    Modified Rankin (Stroke Patients Only)       Balance Overall balance assessment: Needs assistance   Sitting balance-Leahy Scale: Good       Standing balance-Leahy Scale: Fair                              Cognition Arousal/Alertness:  Awake/alert Behavior During Therapy: WFL for tasks assessed/performed Overall Cognitive Status: Within Functional Limits for tasks assessed                                        Exercises      General Comments        Pertinent Vitals/Pain Pain Score: 3  Pain Location: incision Pain Descriptors / Indicators: Operative site guarding Pain Intervention(s): Limited activity within patient's tolerance;Monitored during session;Premedicated before session    Home Living                      Prior Function            PT Goals (current goals can now be found in the care plan section) Acute Rehab PT Goals 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 : A Little Difficulty sitting down on and standing up from a chair with arms (e.g., wheelchair, bedside commode, etc,.)?: A Little Help needed moving to and from a bed to chair (including a wheelchair)?: None Help needed walking in hospital room?: None Help needed climbing 3-5 steps with a railing? : A Little 6 Click Score: 20    End of Session Equipment Utilized During Treatment: Gait belt Activity Tolerance: Patient tolerated treatment well Patient left: in chair;with chair alarm set;with call bell/phone within reach;with family/visitor present Nurse Communication: Mobility status PT Visit Diagnosis: Other abnormalities of gait and mobility (R26.89)     Time: 4098-11910956-1014 PT Time Calculation (min) (ACUTE ONLY): 18  min  Charges:  $Gait Training: 8-22 mins                    G Codes:          Tamala SerUhlenberg, Maxamilian Amadon Kistler 12/13/2017, 10:18 AM [phone]

## 2017-12-13 NOTE — Progress Notes (Signed)
CSW consulted for SNF. Per chart review, PT recommended no PT follow up. Patient to discharge home. CSW signing off, no other needs identified.   Celso SickleKimberly Ceci Taliaferro, [place] Clinical Social Worker [facility] Cell#: [phone]

## 2017-12-13 NOTE — Progress Notes (Signed)
Discharge summary gone over with patient and his wife, including medications, dressing changes, and when to call MD.  Pt and wife verbalizes understanding and had no questions at this time.

## 2017-12-19 NOTE — Discharge Summary (Signed)
Physician Discharge Summary   Patient ID: [name] MRN: [id number] DOB/AGE: [birth year] 72 y.o.  Admit date: 12/12/2017 Discharge date: 12/19/2017  Primary Diagnosis:  Spinal stenosis L4-L5  Admission Diagnoses:  Past Medical History:  Diagnosis Date  . Arthritis    mild lower back  . BPH (benign prostatic hyperplasia)   . Depression   . Dyspnea    with exertion  . Lumbar spinal stenosis   . Pelvic injury 2015   both sides   Discharge Diagnoses:   Active Problems:   Spinal stenosis, lumbar region with neurogenic claudication  Procedure:  Procedure(s) (LRB): Decompressive lumbar laminectomy L4-L5 for stenosis (N/A)   Consults: None  HPI: The patient is a 72 year old male with low back pain. He developed right leg pain and weakness as well. CT myelogram showed a severe block at L4-L5.      Laboratory Data: Hospital Outpatient Visit on 12/06/2017  Component Date Value Ref Range Status  . aPTT 12/06/2017 33  24 - 36 seconds Final   Performed at [facility], [place] [address] [place], [place] [address]  . WBC 12/06/2017 7.9  4.0 - 10.5 K/uL Final  . RBC 12/06/2017 4.93  4.22 - 5.81 MIL/uL Final  . Hemoglobin 12/06/2017 15.2  13.0 - 17.0 g/dL Final  . HCT 12/06/2017 46.2  39.0 - 52.0 % Final  . MCV 12/06/2017 93.7  78.0 - 100.0 fL Final  . MCH 12/06/2017 30.8  26.0 - 34.0 pg Final  . MCHC 12/06/2017 32.9  30.0 - 36.0 g/dL Final  . RDW 12/06/2017 13.4  11.5 - 15.5 % Final  . Platelets 12/06/2017 204  150 - 400 K/uL Final  . Neutrophils Relative % 12/06/2017 59  % Final  . Neutro Abs 12/06/2017 4.7  1.7 - 7.7 K/uL Final  . Lymphocytes Relative 12/06/2017 25  % Final  . Lymphs Abs 12/06/2017 2.0  0.7 - 4.0 K/uL Final  . Monocytes Relative 12/06/2017 8  % Final  . Monocytes Absolute 12/06/2017 0.7  0.1 - 1.0 K/uL Final  . Eosinophils Relative 12/06/2017 7  % Final  . Eosinophils Absolute 12/06/2017 0.5  0.0 - 0.7 K/uL Final  . Basophils  Relative 12/06/2017 1  % Final  . Basophils Absolute 12/06/2017 0.0  0.0 - 0.1 K/uL Final   Performed at [facility], [place] [address] [place], [place] [address]  . Sodium 12/06/2017 143  135 - 145 mmol/L Final  . Potassium 12/06/2017 4.0  3.5 - 5.1 mmol/L Final  . Chloride 12/06/2017 107  101 - 111 mmol/L Final  . CO2 12/06/2017 26  22 - 32 mmol/L Final  . Glucose, Bld 12/06/2017 98  65 - 99 mg/dL Final  . BUN 12/06/2017 17  6 - 20 mg/dL Final  . Creatinine, Ser 12/06/2017 0.90  0.61 - 1.24 mg/dL Final  . Calcium 12/06/2017 9.4  8.9 - 10.3 mg/dL Final  . Total Protein 12/06/2017 7.9  6.5 - 8.1 g/dL Final  . Albumin 12/06/2017 4.1  3.5 - 5.0 g/dL Final  . AST 12/06/2017 26  15 - 41 U/L Final  . ALT 12/06/2017 29  17 - 63 U/L Final  . Alkaline Phosphatase 12/06/2017 88  38 - 126 U/L Final  . Total Bilirubin 12/06/2017 1.1  0.3 - 1.2 mg/dL Final  . GFR calc non Af Amer 12/06/2017 >60  >60 mL/min Final  . GFR calc Af Amer 12/06/2017 >60  >60 mL/min Final   Comment: (  NOTE) The eGFR has been calculated using the CKD EPI equation. This calculation has not been validated in all clinical situations. eGFR's persistently <60 mL/min signify possible Chronic Kidney Disease.   Georgiann Hahn gap 12/06/2017 10  5 - 15 Final   Performed at [facility], [place] [address] [place], [place] [address]  . Prothrombin Time 12/06/2017 12.8  11.4 - 15.2 seconds Final  . INR 12/06/2017 0.97   Final   Performed at [facility], [place] [address] [place], [place] [address]  . ABO/RH(D) 12/06/2017 B POS   Final  . Antibody Screen 12/06/2017 NEG   Final  . Sample Expiration 12/06/2017 12/15/2017   Final  . Extend sample reason 12/06/2017    Final                   Value:NO TRANSFUSIONS OR PREGNANCY IN THE PAST 3 MONTHS Performed at [facility], [place] [address] [place], [place] [address]   . Color, Urine 12/06/2017 YELLOW  YELLOW Final  .  APPearance 12/06/2017 CLEAR  CLEAR Final  . Specific Gravity, Urine 12/06/2017 1.023  1.005 - 1.030 Final  . pH 12/06/2017 5.0  5.0 - 8.0 Final  . Glucose, UA 12/06/2017 NEGATIVE  NEGATIVE mg/dL Final  . Hgb urine dipstick 12/06/2017 SMALL* NEGATIVE Final  . Bilirubin Urine 12/06/2017 NEGATIVE  NEGATIVE Final  . Ketones, ur 12/06/2017 NEGATIVE  NEGATIVE mg/dL Final  . Protein, ur 12/06/2017 NEGATIVE  NEGATIVE mg/dL Final  . Nitrite 12/06/2017 NEGATIVE  NEGATIVE Final  . Leukocytes, UA 12/06/2017 NEGATIVE  NEGATIVE Final  . RBC / HPF 12/06/2017 0-5  0 - 5 RBC/hpf Final  . WBC, UA 12/06/2017 0-5  0 - 5 WBC/hpf Final  . Bacteria, UA 12/06/2017 RARE* NONE SEEN Final  . Squamous Epithelial / LPF 12/06/2017 NONE SEEN  NONE SEEN Final  . Mucus 12/06/2017 PRESENT   Final   Performed at [facility], [place] [address], [place], [place] [address]  . MRSA, PCR 12/06/2017 NEGATIVE  NEGATIVE Final  . Staphylococcus aureus 12/06/2017 NEGATIVE  NEGATIVE Final   Comment: (NOTE) The Xpert SA Assay (FDA approved for NASAL specimens in patients 32 years of age and older), is one component of a comprehensive surveillance program. It is not intended to diagnose infection nor to guide or monitor treatment. Performed at [facility], [place] [address] [place], [place] [address]     X-Rays:Dg Chest 2 View  Result Date: 12/06/2017 CLINICAL DATA:  Preop back surgery EXAM: CHEST - 2 VIEW COMPARISON:  01/25/2016 FINDINGS: The heart size and mediastinal contours are within normal limits. Both lungs are clear. The visualized skeletal structures are unremarkable. IMPRESSION: No active cardiopulmonary disease. Electronically Signed   By: Ashley Royalty M.D.   On: 12/06/2017 23:40   Dg Lumbar Spine 2-3 Views  Result Date: 12/06/2017 CLINICAL DATA:  Preoperative exam for lumbar surgery. Chronic back pain. Right leg pain and numbness. EXAM: LUMBAR SPINE - 2-3 VIEW COMPARISON:  CT  11/22/2017. FINDINGS: Severe diffuse degenerative changes lumbar spine. No acute bony abnormality identified. No evidence of fracture. Total right hip replacement. IMPRESSION: Severe diffuse degenerative changes lumbar spine. No acute abnormality identified. Electronically Signed   By: Marcello Moores  Register   On: 12/06/2017 10:43   Ct Lumbar Spine W Contrast  Result Date: 11/22/2017 CLINICAL DATA:  Low back pain extending into the right groin in medial to the knee into the calf. EXAM: LUMBAR MYELOGRAM FLUOROSCOPY  TIME:  Radiation Exposure Index (as provided by the fluoroscopic device): 347.36 uGy*m2 Fluoroscopy Time:  27 seconds Number of Acquired Images:  0 PROCEDURE: After thorough discussion of risks and benefits of the procedure including bleeding, infection, injury to nerves, blood vessels, adjacent structures as well as headache and CSF leak, written and oral informed consent was obtained. Consent was obtained by Dr. San Morelle. Time out form was completed. Patient was positioned prone on the fluoroscopy table. Local anesthesia was provided with 1% lidocaine without epinephrine after prepped and draped in the usual sterile fashion. Puncture was performed at L3-4 using a 3 1/2 inch 22-gauge spinal needle via left paramedian approach. Using a single pass through the dura, the needle was placed within the thecal sac, with return of clear CSF. 15 mL of Isovue M-200 was injected into the thecal sac, with normal opacification of the nerve roots and cauda equina consistent with free flow within the subarachnoid space. I personally performed the lumbar puncture and administered the intrathecal contrast. I also personally supervised acquisition of the myelogram images. TECHNIQUE: Contiguous axial images were obtained through the Lumbar spine after the intrathecal infusion of infusion. Coronal and sagittal reconstructions were obtained of the axial image sets. COMPARISON:  CT of the abdomen and pelvis  06/14/2011. FINDINGS: LUMBAR MYELOGRAM FINDINGS: Five non rib-bearing lumbar type vertebral bodies are present. A broad-based disc protrusion and endplate changes contribute to moderate central canal stenosis at L1-2. There is also moderate central canal narrowing at L4-5. More mild central canal narrowing is present at L2-3 and L3-4. S1 nerve roots are truncated at the L5-S1 level. Disc protrusions at L1-2 and L4-5 are exaggerated with standing. There is no significant change with flexion or extension. No abnormal motion is present. CT LUMBAR MYELOGRAM FINDINGS: Lumbar spine is imaged from the midbody of T12 through the midbody of S2. Limited imaging of the abdomen is unremarkable. No significant adenopathy is present. T12-L1: Mild disc bulging and facet hypertrophy is present without significant stenosis. L1-2: A broad-based disc protrusion is asymmetric to the right. Moderate right and mild left subarticular stenosis present. Moderate facet hypertrophy contributes. Endplate spurring is worse right than left. Moderate to severe foraminal stenosis bilaterally impacts the L1 nerve roots. L2-3: A broad-based disc protrusion is asymmetric to the left. Moderate facet hypertrophy is present bilaterally. Mild left subarticular narrowing is present. Moderate foraminal stenosis is worse on the left. L3-4: A broad-based disc protrusion is present. Endplate spurring is noted. Mild facet hypertrophy is present bilaterally. Mild central canal narrowing is evident. Moderate foraminal narrowing is evident bilaterally. L4-5: A broad-based disc protrusion is present. Moderate facet hypertrophy and ligamentum flavum thickening contribute to moderate to severe central canal stenosis. Subarticular narrowing is worse on the left. Moderate foraminal stenosis is evident bilaterally. Disc disease is worse on the left. Facet hypertrophy is worse on the right. L5-S1: A broad-based disc protrusion is present. Advanced facet hypertrophy is  worse on the left. Subarticular narrowing is worse on the left. Facet spurring contributes to moderate foraminal narrowing bilaterally. IMPRESSION: 1. Broad-based disc protrusion and facet hypertrophy at L1-2 results in moderate right and mild left subarticular stenosis, potentially impacting the L2 nerve roots, worse on the right. 2. Moderate to severe foraminal stenosis bilaterally at L1-2 likely impacts the L1 nerve roots. 3. Mild left subarticular narrowing at L2-3 with moderate foraminal stenosis bilaterally. 4. Mild central canal and moderate foraminal stenosis bilaterally at L3-4. 5. Moderate to severe central canal stenosis at L4-5 with subarticular  narrowing worse on the left. 6. Moderate foraminal stenosis bilaterally at L4-5. 7. Left greater than right subarticular stenosis with moderate foraminal narrowing bilaterally at L5-S1. The this likely impacts the L5 and S1 nerve roots. Electronically Signed   By: San Morelle M.D.   On: 11/22/2017 14:21   Dg Spine Portable 1 View  Result Date: 12/12/2017 CLINICAL DATA:  Intraoperative localization for L4-L5 laminectomy EXAM: PORTABLE SPINE - 1 VIEW COMPARISON:  Portable exam 0914 hours compared to earlier studies of 12/12/2017 FINDINGS: Image #3: Prior exam is labeled with 5 lumbar vertebra. Spinous processes of current exam labeled accordingly. Metallic probe via dorsal approach projects over the superior aspect of the L5 spinous process, at the level of the L4-L5 disc space. Diffuse disc space narrowing and scattered endplate spur formation lumbar spine. IMPRESSION: Dorsal localization of the L4-L5 disc space level as above. Electronically Signed   By: Lavonia Dana M.D.   On: 12/12/2017 09:55   Dg Spine Portable 1 View  Result Date: 12/12/2017 CLINICAL DATA:  L4-5 laminectomy. EXAM: PORTABLE SPINE - 1 VIEW COMPARISON:  Earlier same day. FINDINGS: Second film shows clamps on the spinous processes of L3 and L4. IMPRESSION: L3 and L4 spinous  processes localized. Electronically Signed   By: Nelson Chimes M.D.   On: 12/12/2017 09:52   Dg Spine Portable 1 View  Result Date: 12/12/2017 CLINICAL DATA:  Intraoperative film for L4-5 surgery. EXAM: PORTABLE SPINE - 1 VIEW COMPARISON:  Radiography 12/06/2017.  Myelography 11/22/2017. FINDINGS: Needles are in place at the level of the inferior spinous process of L3 and the mid spinous process of L4. IMPRESSION: L3 and L4 spinous processes localized. Electronically Signed   By: Nelson Chimes M.D.   On: 12/12/2017 09:14   Dg Myelography Lumbar Inj Lumbosacral  Result Date: 11/22/2017 CLINICAL DATA:  Low back pain extending into the right groin in medial to the knee into the calf. EXAM: LUMBAR MYELOGRAM FLUOROSCOPY TIME:  Radiation Exposure Index (as provided by the fluoroscopic device): 347.36 uGy*m2 Fluoroscopy Time:  27 seconds Number of Acquired Images:  0 PROCEDURE: After thorough discussion of risks and benefits of the procedure including bleeding, infection, injury to nerves, blood vessels, adjacent structures as well as headache and CSF leak, written and oral informed consent was obtained. Consent was obtained by Dr. San Morelle. Time out form was completed. Patient was positioned prone on the fluoroscopy table. Local anesthesia was provided with 1% lidocaine without epinephrine after prepped and draped in the usual sterile fashion. Puncture was performed at L3-4 using a 3 1/2 inch 22-gauge spinal needle via left paramedian approach. Using a single pass through the dura, the needle was placed within the thecal sac, with return of clear CSF. 15 mL of Isovue M-200 was injected into the thecal sac, with normal opacification of the nerve roots and cauda equina consistent with free flow within the subarachnoid space. I personally performed the lumbar puncture and administered the intrathecal contrast. I also personally supervised acquisition of the myelogram images. TECHNIQUE: Contiguous axial  images were obtained through the Lumbar spine after the intrathecal infusion of infusion. Coronal and sagittal reconstructions were obtained of the axial image sets. COMPARISON:  CT of the abdomen and pelvis 06/14/2011. FINDINGS: LUMBAR MYELOGRAM FINDINGS: Five non rib-bearing lumbar type vertebral bodies are present. A broad-based disc protrusion and endplate changes contribute to moderate central canal stenosis at L1-2. There is also moderate central canal narrowing at L4-5. More mild central canal narrowing is present at  L2-3 and L3-4. S1 nerve roots are truncated at the L5-S1 level. Disc protrusions at L1-2 and L4-5 are exaggerated with standing. There is no significant change with flexion or extension. No abnormal motion is present. CT LUMBAR MYELOGRAM FINDINGS: Lumbar spine is imaged from the midbody of T12 through the midbody of S2. Limited imaging of the abdomen is unremarkable. No significant adenopathy is present. T12-L1: Mild disc bulging and facet hypertrophy is present without significant stenosis. L1-2: A broad-based disc protrusion is asymmetric to the right. Moderate right and mild left subarticular stenosis present. Moderate facet hypertrophy contributes. Endplate spurring is worse right than left. Moderate to severe foraminal stenosis bilaterally impacts the L1 nerve roots. L2-3: A broad-based disc protrusion is asymmetric to the left. Moderate facet hypertrophy is present bilaterally. Mild left subarticular narrowing is present. Moderate foraminal stenosis is worse on the left. L3-4: A broad-based disc protrusion is present. Endplate spurring is noted. Mild facet hypertrophy is present bilaterally. Mild central canal narrowing is evident. Moderate foraminal narrowing is evident bilaterally. L4-5: A broad-based disc protrusion is present. Moderate facet hypertrophy and ligamentum flavum thickening contribute to moderate to severe central canal stenosis. Subarticular narrowing is worse on the left.  Moderate foraminal stenosis is evident bilaterally. Disc disease is worse on the left. Facet hypertrophy is worse on the right. L5-S1: A broad-based disc protrusion is present. Advanced facet hypertrophy is worse on the left. Subarticular narrowing is worse on the left. Facet spurring contributes to moderate foraminal narrowing bilaterally. IMPRESSION: 1. Broad-based disc protrusion and facet hypertrophy at L1-2 results in moderate right and mild left subarticular stenosis, potentially impacting the L2 nerve roots, worse on the right. 2. Moderate to severe foraminal stenosis bilaterally at L1-2 likely impacts the L1 nerve roots. 3. Mild left subarticular narrowing at L2-3 with moderate foraminal stenosis bilaterally. 4. Mild central canal and moderate foraminal stenosis bilaterally at L3-4. 5. Moderate to severe central canal stenosis at L4-5 with subarticular narrowing worse on the left. 6. Moderate foraminal stenosis bilaterally at L4-5. 7. Left greater than right subarticular stenosis with moderate foraminal narrowing bilaterally at L5-S1. The this likely impacts the L5 and S1 nerve roots. Electronically Signed   By: San Morelle M.D.   On: 11/22/2017 14:21    EKG: Orders placed or performed during the hospital encounter of 12/06/17  . EKG  . EKG     Hospital Course: Patient was admitted to [facility] and taken to the OR and underwent the above state procedure without complications.  Patient tolerated the procedure well and was later transferred to the recovery room and then to the orthopaedic floor for postoperative care.  They were given PO and IV analgesics for pain control following their surgery.  They were given 24 hours of postoperative antibiotics.   PT was consulted postop to assist with mobility and transfers.  The patient was allowed to be WBAT with therapy and was taught back precautions. Discharge planning was consulted to help with postop disposition and equipment needs.   Patient had a good night on the evening of surgery and started to get up OOB with therapy on day one. Patient was seen in rounds and was ready to go home on day one.  They were given discharge instructions and dressing directions.  They were instructed on when to follow up in the office with Dr. Gladstone Lighter.   Diet: Cardiac diet Activity:WBAT Follow-up:in 2 weeks Disposition - Home Discharged Condition: stable   Discharge Instructions    Call  MD / Call 911   Complete by:  As directed    If you experience chest pain or shortness of breath, CALL 911 and be transported to the hospital emergency room.  If you develope a fever above 101 F, pus (white drainage) or increased drainage or redness at the wound, or calf pain, call your surgeon's office.   Constipation Prevention   Complete by:  As directed    Drink plenty of fluids.  Prune juice may be helpful.  You may use a stool softener, such as Colace (over the counter) 100 mg twice a day.  Use MiraLax (over the counter) for constipation as needed.   Diet - low sodium heart healthy   Complete by:  As directed    Discharge instructions   Complete by:  As directed    For the first three days, remove your dressing, and tape a piece of saran wrap over your incision. Take your shower, then remove the saran wrap and put a clean dressing on. After three days you can shower without the saran wrap.  No lifting or excessive bending No driving while taking pain medications Call Dr. Gladstone Lighter if any wound complications or temperature of 101 degrees F or over.  Call the office for an appointment to see Dr. Gladstone Lighter in two weeks: [phone] and ask for Dr. Charlestine Night nurse, Brunilda Payor.   Increase activity slowly as tolerated   Complete by:  As directed      Allergies as of 12/13/2017   No Known Allergies     Medication List    TAKE these medications   aspirin EC 81 MG tablet Take 81 mg by mouth at bedtime.   atorvastatin 40 MG tablet Commonly  known as:  LIPITOR Take 40 mg by mouth at bedtime.   buPROPion 150 MG 24 hr tablet Commonly known as:  WELLBUTRIN XL Take 150 mg by mouth daily.   Cyanocobalamin 1500 MCG Tbdp Take 1,500 mcg by mouth daily. VITAMIN B-12   DULoxetine 60 MG capsule Commonly known as:  CYMBALTA Take 60 mg by mouth at bedtime.   Fish Oil 1200 MG Caps Take 1,200 mg by mouth at bedtime.   HYDROcodone-acetaminophen 5-325 MG tablet Commonly known as:  NORCO/VICODIN Take 1 tablet by mouth every 4 (four) hours as needed for moderate pain ((score 4 to 6)).   latanoprost 0.005 % ophthalmic solution Commonly known as:  XALATAN Place 1 drop into both eyes at bedtime.   methocarbamol 500 MG tablet Commonly known as:  ROBAXIN Take 1 tablet (500 mg total) by mouth every 6 (six) hours as needed for muscle spasms.   multivitamin with minerals Tabs tablet Take 1 tablet by mouth daily.   naproxen sodium 220 MG tablet Commonly known as:  ALEVE Take 220 mg by mouth 2 (two) times daily as needed (FOR PAIN.).   tamsulosin 0.4 MG Caps capsule Commonly known as:  FLOMAX Take 0.4 mg by mouth at bedtime.      Follow-up Information    Latanya Maudlin, MD. Schedule an appointment as soon as possible for a visit in 2 week(s).   Specialty:  Orthopedic Surgery Contact information: [address] [place] [address] [phone]           Signed: Ardeen Jourdain, PA-C Orthopaedic Surgery 12/19/2017, 10:10 AM

## 2018-08-20 ENCOUNTER — Encounter (HOSPITAL_COMMUNITY): Payer: Self-pay | Admitting: Internal Medicine

## 2018-08-20 ENCOUNTER — Other Ambulatory Visit: Payer: Self-pay

## 2018-08-20 ENCOUNTER — Observation Stay (HOSPITAL_COMMUNITY)
Admission: EM | Admit: 2018-08-20 | Discharge: 2018-08-22 | Disposition: A | Discharge status: Home / Self Care | Payer: Medicare Other | Attending: Nephrology | Admitting: Nephrology

## 2018-08-20 ENCOUNTER — Emergency Department (HOSPITAL_COMMUNITY): Payer: Medicare Other

## 2018-08-20 DIAGNOSIS — M48062 Spinal stenosis, lumbar region with neurogenic claudication: Secondary | ICD-10-CM | POA: Diagnosis not present

## 2018-08-20 DIAGNOSIS — E669 Obesity, unspecified: Secondary | ICD-10-CM | POA: Diagnosis present

## 2018-08-20 DIAGNOSIS — N400 Enlarged prostate without lower urinary tract symptoms: Secondary | ICD-10-CM | POA: Diagnosis present

## 2018-08-20 DIAGNOSIS — Z79899 Other long term (current) drug therapy: Secondary | ICD-10-CM | POA: Diagnosis not present

## 2018-08-20 DIAGNOSIS — E785 Hyperlipidemia, unspecified: Secondary | ICD-10-CM | POA: Diagnosis not present

## 2018-08-20 DIAGNOSIS — M48061 Spinal stenosis, lumbar region without neurogenic claudication: Secondary | ICD-10-CM | POA: Diagnosis present

## 2018-08-20 DIAGNOSIS — Z6834 Body mass index (BMI) 34.0-34.9, adult: Secondary | ICD-10-CM | POA: Diagnosis not present

## 2018-08-20 DIAGNOSIS — Z7982 Long term (current) use of aspirin: Secondary | ICD-10-CM | POA: Diagnosis not present

## 2018-08-20 DIAGNOSIS — F329 Major depressive disorder, single episode, unspecified: Secondary | ICD-10-CM | POA: Diagnosis not present

## 2018-08-20 DIAGNOSIS — J181 Lobar pneumonia, unspecified organism: Secondary | ICD-10-CM

## 2018-08-20 DIAGNOSIS — J188 Other pneumonia, unspecified organism: Secondary | ICD-10-CM | POA: Insufficient documentation

## 2018-08-20 DIAGNOSIS — J189 Pneumonia, unspecified organism: Secondary | ICD-10-CM | POA: Diagnosis not present

## 2018-08-20 DIAGNOSIS — M47816 Spondylosis without myelopathy or radiculopathy, lumbar region: Secondary | ICD-10-CM | POA: Diagnosis not present

## 2018-08-20 DIAGNOSIS — R0902 Hypoxemia: Principal | ICD-10-CM | POA: Diagnosis present

## 2018-08-20 DIAGNOSIS — R531 Weakness: Secondary | ICD-10-CM | POA: Insufficient documentation

## 2018-08-20 DIAGNOSIS — J1100 Influenza due to unidentified influenza virus with unspecified type of pneumonia: Secondary | ICD-10-CM | POA: Diagnosis present

## 2018-08-20 HISTORY — DX: Hyperlipidemia, unspecified: E78.5

## 2018-08-20 LAB — CBC
HCT: 42.5 % (ref 39.0–52.0)
Hemoglobin: 14.1 g/dL (ref 13.0–17.0)
MCH: 30.3 pg (ref 26.0–34.0)
MCHC: 33.2 g/dL (ref 30.0–36.0)
MCV: 91.4 fL (ref 80.0–100.0)
Platelets: 161 10*3/uL (ref 150–400)
RBC: 4.65 MIL/uL (ref 4.22–5.81)
RDW: 12.5 % (ref 11.5–15.5)
WBC: 11.9 10*3/uL — ABNORMAL HIGH (ref 4.0–10.5)
nRBC: 0 % (ref 0.0–0.2)

## 2018-08-20 LAB — BASIC METABOLIC PANEL
Anion gap: 10 (ref 5–15)
BUN: 12 mg/dL (ref 8–23)
CO2: 24 mmol/L (ref 22–32)
Calcium: 8.8 mg/dL — ABNORMAL LOW (ref 8.9–10.3)
Chloride: 102 mmol/L (ref 98–111)
Creatinine, Ser: 0.97 mg/dL (ref 0.61–1.24)
GFR calc Af Amer: >60 mL/min (ref >60)
GLUCOSE: 113 mg/dL — AB (ref 70–99)
POTASSIUM: 4.3 mmol/L (ref 3.5–5.1)
SODIUM: 136 mmol/L (ref 135–145)

## 2018-08-20 LAB — I-STAT ARTERIAL BLOOD GAS, ED
ACID-BASE EXCESS: 1 mmol/L (ref 0.0–2.0)
Bicarbonate: 25.4 mmol/L (ref 20.0–28.0)
O2 SAT: 95 %
Patient temperature: 98.2
TCO2: 27 mmol/L (ref 22–32)
pCO2 arterial: 40.4 mmHg (ref 32.0–48.0)
pH, Arterial: 7.406 (ref 7.350–7.450)
pO2, Arterial: 72 mmHg — ABNORMAL LOW (ref 83.0–108.0)

## 2018-08-20 LAB — CBC WITH DIFFERENTIAL/PLATELET
ABS IMMATURE GRANULOCYTES: 0.04 10*3/uL (ref 0.00–0.07)
BASOS ABS: 0.1 10*3/uL (ref 0.0–0.1)
BASOS PCT: 1 %
Eosinophils Absolute: 0.3 10*3/uL (ref 0.0–0.5)
Eosinophils Relative: 3 %
HCT: 43.9 % (ref 39.0–52.0)
Hemoglobin: 14.5 g/dL (ref 13.0–17.0)
IMMATURE GRANULOCYTES: 0 %
Lymphocytes Relative: 7 %
Lymphs Abs: 0.9 10*3/uL (ref 0.7–4.0)
MCH: 30.5 pg (ref 26.0–34.0)
MCHC: 33 g/dL (ref 30.0–36.0)
MCV: 92.4 fL (ref 80.0–100.0)
Monocytes Absolute: 0.8 10*3/uL (ref 0.1–1.0)
Monocytes Relative: 7 %
NEUTROS ABS: 9.8 10*3/uL — AB (ref 1.7–7.7)
NRBC: 0 % (ref 0.0–0.2)
Neutrophils Relative %: 82 %
PLATELETS: 187 10*3/uL (ref 150–400)
RBC: 4.75 MIL/uL (ref 4.22–5.81)
RDW: 12.5 % (ref 11.5–15.5)
WBC: 11.9 10*3/uL — AB (ref 4.0–10.5)

## 2018-08-20 LAB — CREATININE, SERUM
Creatinine, Ser: 0.97 mg/dL (ref 0.61–1.24)
GFR calc Af Amer: >60 mL/min (ref >60)
GFR calc non Af Amer: >60 mL/min (ref >60)

## 2018-08-20 LAB — ETHANOL: Alcohol, Ethyl (B): <10 mg/dL (ref <10)

## 2018-08-20 MED ORDER — NAPROXEN SODIUM 220 MG PO TABS: 220.0000 mg | ORAL_TABLET | Freq: Two times a day (BID) | ORAL | Status: DC | PRN

## 2018-08-20 MED ORDER — IPRATROPIUM-ALBUTEROL 0.5-2.5 (3) MG/3ML IN SOLN
3.0000 mL | Freq: Once | RESPIRATORY_TRACT | Status: AC
  Administered 2018-08-20: 3 mL via RESPIRATORY_TRACT
  Filled 2018-08-20: qty 3

## 2018-08-20 MED ORDER — SODIUM CHLORIDE 0.9 % IV SOLN
1.0000 g | Freq: Once | INTRAVENOUS | Status: AC
  Administered 2018-08-20: 1 g via INTRAVENOUS
  Filled 2018-08-20: qty 10

## 2018-08-20 MED ORDER — AZITHROMYCIN 250 MG PO TABS
500.0000 mg | ORAL_TABLET | ORAL | Status: DC
  Administered 2018-08-21 – 2018-08-22 (×2): 500 mg via ORAL
  Filled 2018-08-20 (×2): qty 2

## 2018-08-20 MED ORDER — ADULT MULTIVITAMIN W/MINERALS CH
1.0000 | ORAL_TABLET | Freq: Every day | ORAL | Status: DC
  Administered 2018-08-20 – 2018-08-22 (×3): 1 via ORAL
  Filled 2018-08-20 (×3): qty 1

## 2018-08-20 MED ORDER — ALBUTEROL SULFATE (2.5 MG/3ML) 0.083% IN NEBU: 2.5000 mg | INHALATION_SOLUTION | Freq: Four times a day (QID) | RESPIRATORY_TRACT | Status: DC | PRN

## 2018-08-20 MED ORDER — LATANOPROST 0.005 % OP SOLN
1.0000 [drp] | Freq: Every day | OPHTHALMIC | Status: DC
  Administered 2018-08-20 – 2018-08-21 (×2): 1 [drp] via OPHTHALMIC
  Filled 2018-08-20: qty 2.5

## 2018-08-20 MED ORDER — BUPROPION HCL ER (XL) 150 MG PO TB24
150.0000 mg | ORAL_TABLET | Freq: Every day | ORAL | Status: DC
  Administered 2018-08-20 – 2018-08-22 (×3): 150 mg via ORAL
  Filled 2018-08-20 (×3): qty 1

## 2018-08-20 MED ORDER — VITAMIN B-12 1000 MCG PO TABS
1500.0000 ug | ORAL_TABLET | Freq: Every day | ORAL | Status: DC
  Administered 2018-08-20 – 2018-08-22 (×3): 1500 ug via ORAL
  Filled 2018-08-20 (×3): qty 2

## 2018-08-20 MED ORDER — DULOXETINE HCL 60 MG PO CPEP
60.0000 mg | ORAL_CAPSULE | Freq: Every day | ORAL | Status: DC
  Administered 2018-08-20 – 2018-08-21 (×2): 60 mg via ORAL
  Filled 2018-08-20 (×2): qty 1

## 2018-08-20 MED ORDER — ACETAMINOPHEN 325 MG PO TABS
650.0000 mg | ORAL_TABLET | Freq: Four times a day (QID) | ORAL | Status: DC | PRN
  Administered 2018-08-20: 650 mg via ORAL
  Filled 2018-08-20: qty 2

## 2018-08-20 MED ORDER — IPRATROPIUM-ALBUTEROL 0.5-2.5 (3) MG/3ML IN SOLN
3.0000 mL | Freq: Four times a day (QID) | RESPIRATORY_TRACT | Status: DC
  Administered 2018-08-20 – 2018-08-21 (×2): 3 mL via RESPIRATORY_TRACT
  Filled 2018-08-20 (×2): qty 3

## 2018-08-20 MED ORDER — SODIUM CHLORIDE 0.9 % IV SOLN
1.0000 g | INTRAVENOUS | Status: DC
  Administered 2018-08-21: 1 g via INTRAVENOUS
  Filled 2018-08-20 (×2): qty 10

## 2018-08-20 MED ORDER — IPRATROPIUM-ALBUTEROL 0.5-2.5 (3) MG/3ML IN SOLN
3.0000 mL | Freq: Four times a day (QID) | RESPIRATORY_TRACT | Status: DC
  Administered 2018-08-20: 3 mL via RESPIRATORY_TRACT
  Filled 2018-08-20: qty 3

## 2018-08-20 MED ORDER — ATORVASTATIN CALCIUM 40 MG PO TABS
40.0000 mg | ORAL_TABLET | Freq: Every day | ORAL | Status: DC
  Administered 2018-08-20 – 2018-08-21 (×2): 40 mg via ORAL
  Filled 2018-08-20 (×2): qty 1

## 2018-08-20 MED ORDER — AZITHROMYCIN 250 MG PO TABS
500.0000 mg | ORAL_TABLET | Freq: Once | ORAL | Status: AC
  Administered 2018-08-20: 500 mg via ORAL
  Filled 2018-08-20: qty 2

## 2018-08-20 MED ORDER — SODIUM CHLORIDE 0.9 % IV SOLN
INTRAVENOUS | Status: DC
  Administered 2018-08-20 – 2018-08-22 (×3): via INTRAVENOUS

## 2018-08-20 MED ORDER — OMEGA-3-ACID ETHYL ESTERS 1 G PO CAPS
1.0000 g | ORAL_CAPSULE | Freq: Two times a day (BID) | ORAL | Status: DC
  Administered 2018-08-20 – 2018-08-22 (×5): 1 g via ORAL
  Filled 2018-08-20 (×5): qty 1

## 2018-08-20 MED ORDER — TAMSULOSIN HCL 0.4 MG PO CAPS
0.4000 mg | ORAL_CAPSULE | Freq: Every day | ORAL | Status: DC
  Administered 2018-08-20 – 2018-08-21 (×2): 0.4 mg via ORAL
  Filled 2018-08-20 (×2): qty 1

## 2018-08-20 MED ORDER — ENOXAPARIN SODIUM 40 MG/0.4ML ~~LOC~~ SOLN
40.0000 mg | SUBCUTANEOUS | Status: DC
  Administered 2018-08-20 – 2018-08-22 (×2): 40 mg via SUBCUTANEOUS
  Filled 2018-08-20 (×3): qty 0.4

## 2018-08-20 MED ORDER — ASPIRIN EC 81 MG PO TBEC
81.0000 mg | DELAYED_RELEASE_TABLET | Freq: Every day | ORAL | Status: DC
  Administered 2018-08-20 – 2018-08-21 (×2): 81 mg via ORAL
  Filled 2018-08-20 (×2): qty 1

## 2018-08-20 MED ORDER — BENZONATATE 100 MG PO CAPS
200.0000 mg | ORAL_CAPSULE | Freq: Three times a day (TID) | ORAL | Status: DC
  Administered 2018-08-20 – 2018-08-22 (×7): 200 mg via ORAL
  Filled 2018-08-20 (×7): qty 2

## 2018-08-20 NOTE — Care Management Note (Signed)
Case Management Note Robert ConradiWendi Lateisha Thurlow, RN MSN CCM Transitions of Care [place] [phone]  Patient Details  Name: [name] MRN: [id number] Date of Birth: 1945/11/21  Subjective/Objective:      CAP              Action/Plan: PTA home with wife. Weakness, dizziness, fell at home. Decreased O2 sat to 80s while ambulating on RA. Getting IV rocephin. Pt states he has a front wheel walker at home, grab bars, shower chair, and toilet seat from when he had hip surgery. Currently up in chair on RA. No HH services active at this time. No issues with medications or transportation. PCP: Robert Brunetteandace Smith, MD. Will continue to follow for transition of care needs.   Expected Discharge Date:                  Expected Discharge Plan:  Home/Self Care  In-House Referral:     Discharge planning Services  CM Consult  Post Acute Care Choice:    Choice offered to:     DME Arranged:    DME Agency:     HH Arranged:    HH Agency:     Status of Service:     If discussed at [employer] Length of Stay Meetings, dates discussed:    Additional Comments:  Bess KindsWendi B Coy Vandoren, RN 08/20/2018, 2:33 PM

## 2018-08-20 NOTE — ED Notes (Signed)
Back from Radiology.

## 2018-08-20 NOTE — ED Notes (Signed)
Patient transported to X-ray 

## 2018-08-20 NOTE — Care Management Obs Status (Signed)
MEDICARE OBSERVATION STATUS NOTIFICATION   Patient Details  Name: [name] MRN: [id number] Date of Birth: 1946/01/26   Medicare Observation Status Notification Given:  Yes    Bess KindsWendi B Ashiya Kinkead, RN 08/20/2018, 2:29 PM

## 2018-08-20 NOTE — Evaluation (Signed)
Physical Therapy Evaluation Patient Details Name: [name] MRN: [id number] DOB: [birth year] Today's Date: 08/20/2018   History of Present Illness  Patient is a 72 y/o male presenitng to the ED with primary complaints of cough and fall at home. Admitted for CAP with acute hypoxia. PMH significant for dyspnea with exertion, BPH, depression and arthritis.     Clinical Impression  [name] admitted with the above listed diagnosis. Patient reports that prior to admission he was Mod I with mobility and ADLs - requiring assist only for donning of socks. Patient today performing bed mobility, transfers, and gait with RW at general min guard level for safety. Does require some verbal cueing for safety awareness with AD as well as in environment with obstacles. Discussed using RW at home upon discharge for increased safety with mobility. Will recommend HHPT at discharge. PT to follow acutely.   SpO2 on RA at rest 94% SpO2 on RA with mobility 90-92%    Follow Up Recommendations Home health PT;Supervision - Intermittent    Equipment Recommendations  None recommended by PT    Recommendations for Other Services       Precautions / Restrictions Precautions Precautions: Fall Restrictions Weight Bearing Restrictions: No      Mobility  Bed Mobility Overal bed mobility: Modified Independent             General bed mobility comments: increased time and effort - use of bed rails  Transfers Overall transfer level: Needs assistance Equipment used: Rolling walker (2 wheeled) Transfers: Sit to/from Stand Sit to Stand: Supervision         General transfer comment: for safety and immediate standing balance  Ambulation/Gait Ambulation/Gait assistance: Min guard Gait Distance (Feet): 90 Feet Assistive device: Rolling walker (2 wheeled) Gait Pattern/deviations: Step-to pattern;Decreased stride length;Trunk flexed Gait velocity: decreased   General Gait Details: use of UE for  support on RW - no LOB - O2 on RA remaining above 90%  Stairs            Wheelchair Mobility    Modified Rankin (Stroke Patients Only)       Balance Overall balance assessment: Mild deficits observed, not formally tested                                           Pertinent Vitals/Pain Pain Assessment: Faces Faces Pain Scale: Hurts a little bit Pain Location: B shoulders Pain Descriptors / Indicators: Aching;Sore Pain Intervention(s): Limited activity within patient's tolerance;Monitored during session;Repositioned    Home Living Family/patient expects to be discharged to:: Private residence Living Arrangements: Spouse/significant other Available Help at Discharge: Family;Available 24 hours/day Type of Home: House Home Access: Ramped entrance     Home Layout: Multi-level;Able to live on main level with bedroom/bathroom Home Equipment: Dan HumphreysWalker - 2 wheels;Cane - single point;Shower seat;Grab bars - tub/shower;Grab bars - toilet      Prior Function Level of Independence: Independent with assistive device(s)         Comments: intermittent use of AD     Hand Dominance        Extremity/Trunk Assessment   Upper Extremity Assessment Upper Extremity Assessment: Defer to OT evaluation    Lower Extremity Assessment Lower Extremity Assessment: Generalized weakness    Cervical / Trunk Assessment Cervical / Trunk Assessment: Normal  Communication   Communication: No difficulties  Cognition Arousal/Alertness: Awake/alert Behavior During Therapy: [facility]  for tasks assessed/performed Overall Cognitive Status: Within Functional Limits for tasks assessed                                        General Comments General comments (skin integrity, edema, etc.): patients wife present and supportive; discussed using RW short term at home upon discharge for increased safety    Exercises     Assessment/Plan    PT Assessment Patient needs  continued PT services  PT Problem List Decreased strength;Decreased activity tolerance;Decreased balance;Decreased mobility;Decreased knowledge of use of DME;Decreased safety awareness       PT Treatment Interventions DME instruction;Gait training;Functional mobility training;Therapeutic activities;Therapeutic exercise;Balance training;Patient/family education    PT Goals (Current goals can be found in the Care Plan section)  Acute Rehab PT Goals Patient Stated Goal: return home, stop coughing PT Goal Formulation: With patient Time For Goal Achievement: 09/03/18 Potential to Achieve Goals: Good    Frequency Min 3X/week   Barriers to discharge        Co-evaluation               AM-PAC PT "6 Clicks" Mobility  Outcome Measure Help needed turning from your back to your side while in a flat bed without using bedrails?: A Little Help needed moving from lying on your back to sitting on the side of a flat bed without using bedrails?: A Little Help needed moving to and from a bed to a chair (including a wheelchair)?: A Little Help needed standing up from a chair using your arms (e.g., wheelchair or bedside chair)?: A Little Help needed to walk in hospital room?: A Little Help needed climbing 3-5 steps with a railing? : A Lot 6 Click Score: 17    End of Session Equipment Utilized During Treatment: Gait belt Activity Tolerance: Patient tolerated treatment well Patient left: in chair;with call bell/phone within reach;with family/visitor present Nurse Communication: Mobility status PT Visit Diagnosis: Unsteadiness on feet (R26.81);Other abnormalities of gait and mobility (R26.89);Muscle weakness (generalized) (M62.81)    Time: [id number] PT Time Calculation (min) (ACUTE ONLY): 25 min   Charges:   PT Evaluation $PT Eval Moderate Complexity: 1 Mod          Kipp LaurenceStephanie R Baer Hinton, PT, DPT Supplemental Physical Therapist 08/20/18 12:22 PM Pager: [phone] Office:  [phone]

## 2018-08-20 NOTE — ED Notes (Signed)
Pt ambulated in the hallway, SPO2 dropped to 89% and did not get above 93% while walking. Pt had a unsteady gait and stated he was very weak.

## 2018-08-20 NOTE — ED Triage Notes (Signed)
Brought in by EMS with a complain of productive cough and a fall landed on his hip which happened 2 hours ago.. Patient reported that he had taken Delsym liquid and the pill form and did not realized it was the same medicine. Reported felt pale and diaphoretic after the fall but denies dizziness or any fever. BP by EMS was 150/86, 70-90's pulse and 100% on 3l of O2. Patient currently A/Ox4 and neurologically intact.

## 2018-08-20 NOTE — H&P (Signed)
History and Physical:    [name]   NFA:[id number] DOB: 14-Jan-1946 DOA: 08/20/2018  Referring MD/provider: Dr. Bebe Shaggy PCP: Merri Brunette, MD   Patient coming from: Home  Chief Complaint: Cough/fall   History of Present Illness:   [name] is an 72 y.o. male with a PMH of dyspnea with exertion, BPH, depression and arthritis who presents to the ED with upper respiratory symptoms that began 2 days ago.  His symptoms initially began with a cough that worsened, but sputum remained clear.  This morning, when ambulating to the bathroom, he reports getting very dizzy and falling.  He did not pass out but felt very weak and was sweating.  Reports that he took Delsym and Mucinex last night and thinks this may have been the cause of his dizziness this morning.  No aggravating or alleviating factors.  No associated fever or chills.  No known sick contacts.  ED Course: In the ED, vital signs were stable although he was hypoxic with a room air saturation of 89%.  WBC was 11.9.  Chest x-ray showed bibasilar opacities concerning for pneumonia.  Patient was given a DuoNeb, Rocephin and azithromycin and referred for admission to the hospital.  ROS:   Review of Systems  Constitutional: Positive for diaphoresis and malaise/fatigue. Negative for chills, fever and weight loss.  HENT: Negative for congestion, ear pain, sinus pain and sore throat.        Hoarse  Eyes: Negative.   Respiratory: Positive for cough, sputum production and shortness of breath.   Cardiovascular: Negative for chest pain.  Gastrointestinal: Negative for nausea and vomiting.  Genitourinary: Negative.   Musculoskeletal: Positive for myalgias.  Neurological: Positive for dizziness.    Past Medical History:   Past Medical History:  Diagnosis Date  . Arthritis    mild lower back  . BPH (benign prostatic hyperplasia)   . BPH (benign prostatic hyperplasia)   . Depression   . Dyspnea    with exertion  .  HLD (hyperlipidemia)   . Lumbar spinal stenosis   . Pelvic injury 2015   both sides    Past Surgical History:   Past Surgical History:  Procedure Laterality Date  . colonscopy     every 5 yrs  . JOINT REPLACEMENT  12-23-12   right hip replaced  . left carpal tunnel surgery  yrs ago  . LUMBAR LAMINECTOMY/DECOMPRESSION MICRODISCECTOMY N/A 12/12/2017   Procedure: Decompressive lumbar laminectomy L4-L5 for stenosis;  Surgeon: Ranee Gosselin, MD;  Location: [facility];  Service: Orthopedics;  Laterality: N/A;  . SHOULDER ARTHROSCOPY WITH ROTATOR CUFF REPAIR AND SUBACROMIAL DECOMPRESSION  12-23-12   Right/ left shoulder(microscopic surgery)  . SHOULDER OPEN ROTATOR CUFF REPAIR Left 01/01/2013   Procedure: OPEN ANTERIOR ACROMINECTOMY/ROTATOR CUFF REPAIR/DISTAL CLAVICLE RESECTION  LEFT SHOULDER;  Surgeon: Drucilla Schmidt, MD;  Location: [facility];  Service: Orthopedics;  Laterality: Left;  . TONSILLECTOMY     child    Social History:   Social History   Socioeconomic History  . Marital status: Married    Spouse name: Not on file  . Number of children: Not on file  . Years of education: Not on file  . Highest education level: Not on file  Occupational History  . Not on file  Social Needs  . Financial resource strain: Not on file  . Food insecurity:    Worry: Not on file    Inability: Not on file  . Transportation needs:    Medical:  Not on file    Non-medical: Not on file  Tobacco Use  . Smoking status: Never Smoker  . Smokeless tobacco: Never Used  Substance and Sexual Activity  . Alcohol use: No  . Drug use: No  . Sexual activity: Yes  Lifestyle  . Physical activity:    Days per week: Not on file    Minutes per session: Not on file  . Stress: Not on file  Relationships  . Social connections:    Talks on phone: Not on file    Gets together: Not on file    Attends religious service: Not on file    Active member of club or organization: Not on file    Attends meetings of  clubs or organizations: Not on file    Relationship status: Not on file  . Intimate partner violence:    Fear of current or ex partner: Not on file    Emotionally abused: Not on file    Physically abused: Not on file    Forced sexual activity: Not on file  Other Topics Concern  . Not on file  Social History Narrative  . Not on file    Allergies   Patient has no known allergies.  Family history:   Family History  Problem Relation Age of Onset  . Alzheimer's disease Mother   . Colon cancer Father   . Lung cancer Sister     Current Medications:   Prior to Admission medications   Medication Sig Start Date End Date Taking? Authorizing Provider  aspirin EC 81 MG tablet Take 81 mg by mouth at bedtime.   Yes [provider]  atorvastatin (LIPITOR) 40 MG tablet Take 40 mg by mouth at bedtime.    Yes [provider]  buPROPion (WELLBUTRIN XL) 150 MG 24 hr tablet Take 150 mg by mouth daily. 10/17/17  Yes [provider]  Cyanocobalamin 1500 MCG TBDP Take 1,500 mcg by mouth daily. VITAMIN B-12   Yes [provider]  DULoxetine (CYMBALTA) 60 MG capsule Take 60 mg by mouth at bedtime.    Yes [provider]  latanoprost (XALATAN) 0.005 % ophthalmic solution Place 1 drop into both eyes at bedtime.   Yes [provider]  Multiple Vitamin (MULTIVITAMIN WITH MINERALS) TABS Take 1 tablet by mouth daily.   Yes [provider]  naproxen sodium (ALEVE) 220 MG tablet Take 220 mg by mouth 2 (two) times daily as needed (FOR PAIN.).   Yes [provider]  Omega-3 Fatty Acids (FISH OIL) 1200 MG CAPS Take 1,200 mg by mouth at bedtime.   Yes [provider]  tamsulosin (FLOMAX) 0.4 MG CAPS capsule Take 0.4 mg by mouth at bedtime.    Yes [provider]  HYDROcodone-acetaminophen (NORCO/VICODIN) 5-325 MG tablet Take 1 tablet by mouth every 4 (four) hours as needed for moderate pain ((score 4 to 6)). Patient not  taking: Reported on 08/20/2018 12/12/17   Dimitri Pedonstable, Amber, PA-C  methocarbamol (ROBAXIN) 500 MG tablet Take 1 tablet (500 mg total) by mouth every 6 (six) hours as needed for muscle spasms. Patient not taking: Reported on 08/20/2018 12/12/17   Dimitri Pedonstable, Amber, PA-C    Physical Exam:   Vitals:   08/20/18 0558 08/20/18 0615 08/20/18 0716 08/20/18 0745  BP:  (!) 142/72  (!) 145/63  Pulse:  81  87  Resp:  14  19  Temp:   98.4 F (36.9 C)   TempSrc:   Oral   SpO2:  93%  (!) 89%  Weight: 110.2 kg     Height: 5\' 10"  (1.778 m)        Physical Exam: Blood pressure (!) 145/63, pulse 87, temperature 98.4 F (36.9 C), temperature source Oral, resp. rate 19, height 5\' 10"  (1.778 m), weight 110.2 kg, SpO2 (!) 89 %. Gen: Ill-appearing male having paroxysms of cough. Head: Normocephalic, atraumatic. Eyes: Pupils equal, round and reactive to light.  Sclerae injected bilaterally with erythema.  Extraocular movements intact.  No lid lag. Mouth: Oropharynx reveals posterior pharyngeal erythema. Dentition is fair with missing teeth and caries. Neck: Supple, no thyromegaly, no lymphadenopathy, no jugular venous distention. Chest: Lung sounds reveal scattered rhonchi and an occasional wheeze with forceful coughing. CV: Heart sounds are regular with an S1, S2. No murmurs, rubs, clicks, or gallops.  Abdomen: Soft, nontender, nondistended with normal active bowel sounds. No hepatosplenomegaly or palpable masses. Extremities: Extremities are without clubbing, or cyanosis. No edema. Pedal pulses 2+.  Skin: Warm and dry. No rashes, lesions or wounds. Neuro: Alert and oriented times 3; grossly nonfocal.  Hard of hearing. Psych: Insight is good and judgment is appropriate. Mood and affect normal.   Data Review:    Labs: Basic Metabolic Panel: Recent Labs  Lab 08/20/18 0558  NA 136  K 4.3  CL 102  CO2 24  GLUCOSE 113*  BUN 12  CREATININE 0.97  CALCIUM 8.8*   CBC: Recent Labs  Lab  08/20/18 0558  WBC 11.9*  NEUTROABS 9.8*  HGB 14.5  HCT 43.9  MCV 92.4  PLT 187    Radiographic Studies: Dg Chest 2 View  Result Date: 08/20/2018 CLINICAL DATA:  Acute onset of productive cough. Status post fall. Diaphoresis. EXAM: CHEST - 2 VIEW COMPARISON:  Chest radiograph performed 12/06/2017 FINDINGS: The lungs are well-aerated. Mild bibasilar opacities raise concern for pneumonia. There is no evidence of pleural effusion or pneumothorax. The heart is normal in size; the mediastinal contour is within normal limits. No acute osseous abnormalities are seen. There is chronic resorption or resection of the distal right clavicle. IMPRESSION: Mild bibasilar opacities raise concern for pneumonia. Electronically Signed   By: Roanna RaiderJeffery  Chang M.D.   On: 08/20/2018 06:50    EKG: Independently reviewed.  Sinus rhythm with abnormal R wave progression and T wave abnormalities in the lateral leads.  No frank ischemic changes.   Assessment/Plan:   Principal Problem:   CAP (community acquired pneumonia) associated with acute hypoxia Chest x-ray personally reviewed and shows bibasilar infiltrates.  No fever, so doubt this is viral/influenza.  Will obtain blood cultures and strep pneumonia antigen testing.  Given acute hypoxia, will check a blood gas, and place on supplemental oxygen as well as duo nebs every 6 hours.  Empiric treatment with IV Rocephin and oral azithromycin has been ordered.  We will also place on antitussives.  PSI score consistent with class III, so observation appropriate at this time.  Active Problems:   Generalized weakness Likely from acute illness.  Will order physical therapy.    Spinal stenosis, lumbar region with neurogenic claudication May have been contributory to acute fall.  Outpatient follow-up recommended.    Hyperlipidemia Continue statin.    BPH (benign prostatic hyperplasia) Continue Flomax.    Obesity Body mass index is 34.87 kg/m.  Other  information:   DVT prophylaxis: Lovenox ordered. Code Status: Full code. Family Communication: Wife updated at the bedside. Disposition Plan: Home in 48 hours if stable. Consults called: Physical therapy. Admission status: Observation.  If fails observation therapy after 48 hours, will move to inpatient.  The medical decision making on this patient was of high complexity and the patient is at high risk for clinical deterioration, therefore this is a level 3 visit.   Trula Ore Rama [facility] Pager [phone] Cell: [phone]   If 7PM-7AM, please contact night-coverage [email or web address] Password [email or web address] 08/20/2018, 8:30 AM

## 2018-08-20 NOTE — ED Provider Notes (Signed)
[facility] EMERGENCY DEPARTMENT Provider Note   CSN: [id number] Arrival date & time: 08/20/18  0551     History   Chief Complaint Chief Complaint  Patient presents with  . Fall  . Cough    HPI [name] is a 72 y.o. male.  The history is provided by the patient and the spouse.  Fall  This is a new problem. Episode onset: just prior to arrival. The problem has been gradually worsening. Pertinent negatives include no chest pain and no abdominal pain. The symptoms are aggravated by walking. The symptoms are relieved by rest.  Cough  Associated symptoms include myalgias. Pertinent negatives include no chest pain.  Patient presents for fall.  He reports he took some cough medicine prior to going to bed, when he woke up to go the restroom he felt lightheaded and fell.  Denies major head injury or LOC.  He did feel too dizzy to stand up after the fall.  He reports he has had cough for up to 2 days.  No hemoptysis.  No active chest pain or shortness of breath.  No fevers, but wife reports he has been diaphoretic.  Reports feeling fatigue Denies any injuries from the fall  Past Medical History:  Diagnosis Date  . Arthritis    mild lower back  . BPH (benign prostatic hyperplasia)   . Depression   . Dyspnea    with exertion  . Lumbar spinal stenosis   . Pelvic injury 2015   both sides    Patient Active Problem List   Diagnosis Date Noted  . Spinal stenosis, lumbar region with neurogenic claudication 12/12/2017    Past Surgical History:  Procedure Laterality Date  . colonscopy     every 5 yrs  . JOINT REPLACEMENT  12-23-12   right hip replaced  . left carpal tunnel surgery  yrs ago  . LUMBAR LAMINECTOMY/DECOMPRESSION MICRODISCECTOMY N/A 12/12/2017   Procedure: Decompressive lumbar laminectomy L4-L5 for stenosis;  Surgeon: Ranee Gosselin, MD;  Location: [facility];  Service: Orthopedics;  Laterality: N/A;  . SHOULDER ARTHROSCOPY WITH ROTATOR CUFF REPAIR  AND SUBACROMIAL DECOMPRESSION  12-23-12   Right/ left shoulder(microscopic surgery)  . SHOULDER OPEN ROTATOR CUFF REPAIR Left 01/01/2013   Procedure: OPEN ANTERIOR ACROMINECTOMY/ROTATOR CUFF REPAIR/DISTAL CLAVICLE RESECTION  LEFT SHOULDER;  Surgeon: Drucilla Schmidt, MD;  Location: [facility];  Service: Orthopedics;  Laterality: Left;  . TONSILLECTOMY     child        Home Medications    Prior to Admission medications   Medication Sig Start Date End Date Taking? Authorizing Provider  aspirin EC 81 MG tablet Take 81 mg by mouth at bedtime.    [provider]  atorvastatin (LIPITOR) 40 MG tablet Take 40 mg by mouth at bedtime.     [provider]  buPROPion (WELLBUTRIN XL) 150 MG 24 hr tablet Take 150 mg by mouth daily. 10/17/17   [provider]  Cyanocobalamin 1500 MCG TBDP Take 1,500 mcg by mouth daily. VITAMIN B-12    [provider]  DULoxetine (CYMBALTA) 60 MG capsule Take 60 mg by mouth at bedtime.     [provider]  HYDROcodone-acetaminophen (NORCO/VICODIN) 5-325 MG tablet Take 1 tablet by mouth every 4 (four) hours as needed for moderate pain ((score 4 to 6)). 12/12/17   Constable, Amber, PA-C  latanoprost (XALATAN) 0.005 % ophthalmic solution Place 1 drop into both eyes at bedtime.    [provider]  methocarbamol (  ROBAXIN) 500 MG tablet Take 1 tablet (500 mg total) by mouth every 6 (six) hours as needed for muscle spasms. 12/12/17   Constable, Amber, PA-C  Multiple Vitamin (MULTIVITAMIN WITH MINERALS) TABS Take 1 tablet by mouth daily.    [provider]  naproxen sodium (ALEVE) 220 MG tablet Take 220 mg by mouth 2 (two) times daily as needed (FOR PAIN.).    [provider]  Omega-3 Fatty Acids (FISH OIL) 1200 MG CAPS Take 1,200 mg by mouth at bedtime.    [provider]  tamsulosin (FLOMAX) 0.4 MG CAPS capsule Take 0.4 mg by mouth at bedtime.     [provider]    Family History No family  history on file.  Social History Social History   Tobacco Use  . Smoking status: Never Smoker  . Smokeless tobacco: Never Used  Substance Use Topics  . Alcohol use: No  . Drug use: No     Allergies   Patient has no known allergies.   Review of Systems Review of Systems  Constitutional: Positive for fatigue.  Respiratory: Positive for cough.   Cardiovascular: Negative for chest pain.  Gastrointestinal: Negative for abdominal pain.  Musculoskeletal: Positive for myalgias.  All other systems reviewed and are negative.    Physical Exam Updated Vital Signs BP (!) 142/72   Pulse 81   Resp 14   Ht 1.778 m (5\' 10" )   Wt 110.2 kg   SpO2 93%   BMI 34.87 kg/m   Physical Exam CONSTITUTIONAL: Elderly and chronically ill-appearing HEAD: Normocephalic/atraumatic EYES: EOMI/PERRL ENMT: Mucous membranes moist, poor dentition NECK: supple no meningeal signs SPINE/BACK:entire spine nontender CV: S1/S2 noted, no murmurs/rubs/gallops noted LUNGS: Decreased breath sounds in the bases, no acute distress ABDOMEN: soft, nontender, no rebound or guarding, bowel sounds noted throughout abdomen GU:no cva tenderness NEURO: Pt is awake/alert/appropriate, moves all extremitiesx4.  No facial droop.   EXTREMITIES: pulses normal/equal, full ROM, pelvis stable, all other extremities/joints palpated/ranged and nontender SKIN: warm, color normal PSYCH: no abnormalities of mood noted, alert and oriented to situation   ED Treatments / Results  Labs (all labs ordered are listed, but only abnormal results are displayed) Labs Reviewed  BASIC METABOLIC PANEL - Abnormal; Notable for the following components:      Result Value   Glucose, Bld 113 (*)    Calcium 8.8 (*)    All other components within normal limits  CBC WITH DIFFERENTIAL/PLATELET - Abnormal; Notable for the following components:   WBC 11.9 (*)    Neutro Abs 9.8 (*)    All other components within normal limits  ETHANOL     EKG EKG Interpretation  Date/Time:  Tuesday August 20 2018 06:36:02 EST Ventricular Rate:  87 PR Interval:    QRS Duration: 90 QT Interval:  361 QTC Calculation: 435 R Axis:   36 Text Interpretation:  Sinus rhythm Abnormal R-wave progression, early transition Borderline T abnormalities, lateral leads No significant change since last tracing Confirmed by Zadie RhineWickline, Betzabe Bevans ([id number]) on 08/20/2018 6:54:14 AM Prehospital EKG is reviewed and is unremarkable  Radiology Dg Chest 2 View  Result Date: 08/20/2018 CLINICAL DATA:  Acute onset of productive cough. Status post fall. Diaphoresis. EXAM: CHEST - 2 VIEW COMPARISON:  Chest radiograph performed 12/06/2017 FINDINGS: The lungs are well-aerated. Mild bibasilar opacities raise concern for pneumonia. There is no evidence of pleural effusion or pneumothorax. The heart is normal in size; the mediastinal contour is within normal limits. No acute osseous abnormalities are seen.  There is chronic resorption or resection of the distal right clavicle. IMPRESSION: Mild bibasilar opacities raise concern for pneumonia. Electronically Signed   By: Roanna RaiderJeffery  Chang M.D.   On: 08/20/2018 06:50    Procedures Procedures   Medications Ordered in ED Medications  cefTRIAXone (ROCEPHIN) 1 g in sodium chloride 0.9 % 100 mL IVPB (1 g Intravenous New Bag/Given 08/20/18 0726)  ipratropium-albuterol (DUONEB) 0.5-2.5 (3) MG/3ML nebulizer solution 3 mL (3 mLs Nebulization Given 08/20/18 0650)  azithromycin (ZITHROMAX) tablet 500 mg (500 mg Oral Given 08/20/18 0726)     Initial Impression / Assessment and Plan / ED Course  I have reviewed the triage vital signs and the nursing notes.  Pertinent labs & imaging results that were available during my care of the patient were reviewed by me and considered in my medical decision making (see chart for details).     7:46 AM Patient presents for cough for over 2 days and then tonight felt weak after standing up and got  dizzy and fell.  His x-rays are consistent with pneumonia.  He attempted to ambulate in the ER, but he became hypoxic and felt unsteady and weak.  Due to his age and associated symptoms, feel he would benefit from admission. Patient and spouse are in agreement with plan. He confirms that he is a non-smoker and is not on oxygen chronically. 7:54 AM D/w dr Darnelle Catalanrama for admission for pneumonia  Final Clinical Impressions(s) / ED Diagnoses   Final diagnoses:  Community acquired pneumonia of left lower lobe of lung [facility](HCC)  Hypoxia    ED Discharge Orders    None       Zadie RhineWickline, Cullen Lahaie, MD 08/20/18 [phone]

## 2018-08-21 ENCOUNTER — Encounter (HOSPITAL_COMMUNITY): Payer: Self-pay | Admitting: *Deleted

## 2018-08-21 DIAGNOSIS — J1100 Influenza due to unidentified influenza virus with unspecified type of pneumonia: Secondary | ICD-10-CM | POA: Diagnosis not present

## 2018-08-21 LAB — EXPECTORATED SPUTUM ASSESSMENT W REFEX TO RESP CULTURE

## 2018-08-21 LAB — EXPECTORATED SPUTUM ASSESSMENT W GRAM STAIN, RFLX TO RESP C: Special Requests: NORMAL

## 2018-08-21 LAB — INFLUENZA PANEL BY PCR (TYPE A & B)
INFLAPCR: POSITIVE — AB
Influenza B By PCR: NEGATIVE

## 2018-08-21 LAB — STREP PNEUMONIAE URINARY ANTIGEN: Strep Pneumo Urinary Antigen: NEGATIVE

## 2018-08-21 MED ORDER — OSELTAMIVIR PHOSPHATE 75 MG PO CAPS
75.0000 mg | ORAL_CAPSULE | Freq: Two times a day (BID) | ORAL | Status: DC
  Administered 2018-08-21 – 2018-08-22 (×3): 75 mg via ORAL
  Filled 2018-08-21 (×3): qty 1

## 2018-08-21 MED ORDER — IPRATROPIUM-ALBUTEROL 0.5-2.5 (3) MG/3ML IN SOLN
3.0000 mL | Freq: Three times a day (TID) | RESPIRATORY_TRACT | Status: DC
  Administered 2018-08-21 – 2018-08-22 (×2): 3 mL via RESPIRATORY_TRACT
  Filled 2018-08-21 (×2): qty 3

## 2018-08-21 NOTE — Progress Notes (Signed)
Patient is now on RA sat- 91-92%. Patient tolerating well, and will continue to monitor.   Lillia PaulsLaura B. RN

## 2018-08-21 NOTE — Progress Notes (Signed)
PROGRESS NOTE    [name]  RUE:[id number] DOB: [birth year] DOA: 08/20/2018 PCP: Merri BrunetteSmith, Candace, MD   Brief Narrative:  [name] is an 72 y.o. male with a PMH of dyspnea with exertion, BPH, depression and arthritis who presents to the ED with upper respiratory symptoms that began 2 days ago.  His symptoms initially began with a cough that worsened, but sputum remained clear.    Yesterday morning, when ambulating to the bathroom, he reported getting very dizzy and falling.  He did not pass out but felt very weak and was sweating.  Reports that he took Delsym and Mucinex last night and thinks this may have been the cause of his dizziness this morning.  No aggravating or alleviating factors.  No associated fever or chills.  No known sick contacts.  The emergency department he was found to be hypoxic with a room air sat of 89% chest x-ray showed bibasilar opacities concerning for pneumonia.  Was given DuoNeb Rocephin and azithromycin referred to hospitalist for admission.  Overnight he developed a fever and a flu test was obtained today and it is positive for influenza A.  We have started Tamiflu.   Assessment & Plan:   Principal Problem:   Influenza with pneumonia Active Problems:   CAP (community acquired pneumonia)   Hypoxia   Spinal stenosis, lumbar region with neurogenic claudication   Hyperlipidemia   BPH (benign prostatic hyperplasia)   Obesity (BMI 30-39.9)     Influenza with associated pneumonia associated with acute hypoxia Chest x-ray personally reviewed and shows bibasilar infiltrates.    Patient febrile overnight and influenza test came back positive Tamiflu has been started.   Cultures are pending.  Strep pneumonia antigen was negative.  Sputum culture is pending.   Continue duo nebs wean oxygen to room air when able. Empiric treatment with IV Rocephin and oral azithromycin has been ordered.  We will also place on antitussives.  PSI score consistent with class III, so  observation appropriate at this time.  Active Problems:   Generalized weakness Likely from acute influenza.  Zickel therapy recommends home health physical therapy.    Spinal stenosis, lumbar region with neurogenic claudication May have been contributory to acute fall.  Outpatient follow-up recommended.    Hyperlipidemia Continue statin.    BPH (benign prostatic hyperplasia) Continue Flomax.    Obesity Body mass index is 34.87 kg/m.   DVT prophylaxis: Lovenox ordered. Code Status: Full code. Family Communication:  Family present Disposition Plan: Home in 48 hours if stable. Consults called: Physical therapy. Admission status: Observation.  If fails observation therapy after 48 hours, will move to inpatient.  Antimicrobials:   Ceftriaxone, day 2  Azithromycin, day 2  Tamiflu day 1   Subjective: Feels awful slightly better than yesterday but not much.  Looks pretty tired and watery eyes weak extremities  Objective: Vitals:   08/21/18 0010 08/21/18 0419 08/21/18 0820 08/21/18 0832  BP:  133/67 (!) 144/82   Pulse:  91 89   Resp:  20 14   Temp:  99.7 F (37.6 C) 98.5 F (36.9 C)   TempSrc:  Oral Oral   SpO2:  97% 93% 98%  Weight: 108.8 kg     Height:        Intake/Output Summary (Last 24 hours) at 08/21/2018 1141 Last data filed at 08/21/2018 0600 Gross per 24 hour  Intake 2639.7 ml  Output 475 ml  Net 2164.7 ml   Filed Weights   08/20/18 0558 08/20/18  1610 08/21/18 0010  Weight: 110.2 kg 109.2 kg 108.8 kg    Examination:  General exam: Appears calm and comfortable  Respiratory system: Clear to auscultation anteriorly.  Posteriorly patient with mild rales appreciated at the left base respiratory effort normal. Cardiovascular system: S1 & S2 heard, RRR. No JVD, murmurs, rubs, gallops or clicks. No pedal edema. Gastrointestinal system: Abdomen is nondistended, soft and nontender. No organomegaly or masses felt. Normal bowel sounds heard. Central  nervous system: Alert and oriented. No focal neurological deficits. Extremities: Symmetric 5 x 5 power. Skin: No rashes, lesions or ulcers Psychiatry: Judgement and insight appear normal. Mood & affect appropriate.     Data Reviewed: I have personally reviewed following labs and imaging studies  CBC: Recent Labs  Lab 08/20/18 0558 08/20/18 0908  WBC 11.9* 11.9*  NEUTROABS 9.8*  --   HGB 14.5 14.1  HCT 43.9 42.5  MCV 92.4 91.4  PLT 187 161   Basic Metabolic Panel: Recent Labs  Lab 08/20/18 0558 08/20/18 0908  NA 136  --   K 4.3  --   CL 102  --   CO2 24  --   GLUCOSE 113*  --   BUN 12  --   CREATININE 0.97 0.97  CALCIUM 8.8*  --    GFR: Estimated Creatinine Clearance: 85 mL/min (by C-G formula based on SCr of 0.97 mg/dL). Liver Function Tests: No results for input(s): AST, ALT, ALKPHOS, BILITOT, PROT, ALBUMIN in the last 168 hours. No results for input(s): LIPASE, AMYLASE in the last 168 hours. No results for input(s): AMMONIA in the last 168 hours. Coagulation Profile: No results for input(s): INR, PROTIME in the last 168 hours. Cardiac Enzymes: No results for input(s): CKTOTAL, CKMB, CKMBINDEX, TROPONINI in the last 168 hours. BNP (last 3 results) No results for input(s): PROBNP in the last 8760 hours. HbA1C: No results for input(s): HGBA1C in the last 72 hours. CBG: No results for input(s): GLUCAP in the last 168 hours. Lipid Profile: No results for input(s): CHOL, HDL, LDLCALC, TRIG, CHOLHDL, LDLDIRECT in the last 72 hours. Thyroid Function Tests: No results for input(s): TSH, T4TOTAL, FREET4, T3FREE, THYROIDAB in the last 72 hours. Anemia Panel: No results for input(s): VITAMINB12, FOLATE, FERRITIN, TIBC, IRON, RETICCTPCT in the last 72 hours. Sepsis Labs: No results for input(s): PROCALCITON, LATICACIDVEN in the last 168 hours.  Recent Results (from the past 240 hour(s))  Culture, blood (routine x 2) Call MD if unable to obtain prior to antibiotics  being given     Status: None (Preliminary result)   Collection Time: 08/20/18  9:08 AM  Result Value Ref Range Status   Specimen Description BLOOD RIGHT ARM  Final   Special Requests   Final    BOTTLES DRAWN AEROBIC AND ANAEROBIC Blood Culture adequate volume Performed at [facility], [address], [place], [place] [address]    Culture NO GROWTH < 24 HOURS  Final   Report Status PENDING  Incomplete  Culture, blood (routine x 2) Call MD if unable to obtain prior to antibiotics being given     Status: None (Preliminary result)   Collection Time: 08/20/18 10:02 AM  Result Value Ref Range Status   Specimen Description BLOOD RIGHT ANTECUBITAL  Final   Special Requests   Final    BOTTLES DRAWN AEROBIC ONLY Blood Culture adequate volume   Culture NO GROWTH < 24 HOURS  Final   Report Status PENDING  Incomplete  Culture, sputum-assessment  Status: None   Collection Time: 08/21/18  6:48 AM  Result Value Ref Range Status   Specimen Description SPUTUM  Final   Special Requests Normal  Final   Sputum evaluation   Final    THIS SPECIMEN IS ACCEPTABLE FOR SPUTUM CULTURE Performed at [facility], [address] [place], [place] [address]    Report Status 08/21/2018 FINAL  Final  Culture, respiratory     Status: None (Preliminary result)   Collection Time: 08/21/18  6:48 AM  Result Value Ref Range Status   Specimen Description SPUTUM  Final   Special Requests Normal Reflexed from [id number]  Final   Gram Stain   Final    MODERATE WBC PRESENT, PREDOMINANTLY PMN RARE SQUAMOUS EPITHELIAL CELLS PRESENT FEW GRAM POSITIVE COCCI IN PAIRS IN CHAINS FEW GRAM NEGATIVE RODS FEW GRAM POSITIVE RODS Performed at [facility], [address] [place], [place] [address]    Culture PENDING  Incomplete   Report Status PENDING  Incomplete         Radiology Studies: Dg Chest 2 View  Result Date: 08/20/2018 CLINICAL DATA:  Acute onset of productive cough. Status post fall.  Diaphoresis. EXAM: CHEST - 2 VIEW COMPARISON:  Chest radiograph performed 12/06/2017 FINDINGS: The lungs are well-aerated. Mild bibasilar opacities raise concern for pneumonia. There is no evidence of pleural effusion or pneumothorax. The heart is normal in size; the mediastinal contour is within normal limits. No acute osseous abnormalities are seen. There is chronic resorption or resection of the distal right clavicle. IMPRESSION: Mild bibasilar opacities raise concern for pneumonia. Electronically Signed   By: Roanna RaiderJeffery  Chang M.D.   On: 08/20/2018 06:50        Scheduled Meds: . aspirin EC  81 mg Oral QHS  . atorvastatin  40 mg Oral q1800  . azithromycin  500 mg Oral Q24H  . benzonatate  200 mg Oral TID  . buPROPion  150 mg Oral Daily  . DULoxetine  60 mg Oral QHS  . enoxaparin (LOVENOX) injection  40 mg Subcutaneous Q24H  . ipratropium-albuterol  3 mL Nebulization TID  . latanoprost  1 drop Both Eyes QHS  . multivitamin with minerals  1 tablet Oral Daily  . omega-3 acid ethyl esters  1 g Oral BID  . oseltamivir  75 mg Oral BID  . tamsulosin  0.4 mg Oral QHS  . vitamin B-12  1,500 mcg Oral Daily   Continuous Infusions: . sodium chloride 75 mL/hr at 08/20/18 2248  . cefTRIAXone (ROCEPHIN)  IV 1 g (08/21/18 0818)     LOS: 0 days    Time spent: 35 minutes    Lahoma Crockerheresa C Jereline Ticer, MD FACP [facility] Pager [phone]  If 7PM-7AM, please contact night-coverage [email or web address] Password [email or web address] 08/21/2018, 11:41 AM

## 2018-08-22 DIAGNOSIS — J189 Pneumonia, unspecified organism: Secondary | ICD-10-CM | POA: Diagnosis not present

## 2018-08-22 DIAGNOSIS — R0902 Hypoxemia: Secondary | ICD-10-CM | POA: Diagnosis not present

## 2018-08-22 DIAGNOSIS — J1100 Influenza due to unidentified influenza virus with unspecified type of pneumonia: Secondary | ICD-10-CM | POA: Diagnosis not present

## 2018-08-22 DIAGNOSIS — E669 Obesity, unspecified: Secondary | ICD-10-CM | POA: Diagnosis not present

## 2018-08-22 MED ORDER — AMOXICILLIN 500 MG PO CAPS
500.0000 mg | ORAL_CAPSULE | Freq: Three times a day (TID) | ORAL | Status: DC
  Administered 2018-08-22: 500 mg via ORAL
  Filled 2018-08-22 (×2): qty 1

## 2018-08-22 MED ORDER — AMOXICILLIN 500 MG PO CAPS: 500.0000 mg | ORAL_CAPSULE | Freq: Three times a day (TID) | ORAL | 0 refills | Status: DC

## 2018-08-22 MED ORDER — OSELTAMIVIR PHOSPHATE 75 MG PO CAPS: 75.0000 mg | ORAL_CAPSULE | Freq: Two times a day (BID) | ORAL | 0 refills | Status: AC

## 2018-08-22 NOTE — Care Management (Signed)
Case manager spoke with patient and his wife concerning therapy recommendation for HHPT. Patient politely declines. Wife said he is doing much better and doesn't think they need it. No further CM needs. Patient discharging home.

## 2018-08-22 NOTE — Progress Notes (Signed)
Physical Therapy Treatment Patient Details Name: Robert MechanicBurton L Fukuhara MRN: [id number] DOB: 10/30/1945 Today's Date: 08/22/2018    History of Present Illness Patient is a 72 y/o male presenitng to the ED with primary complaints of cough and fall at home. Admitted for CAP with acute hypoxia. PMH significant for dyspnea with exertion, BPH, depression and arthritis.     PT Comments    Patient progressing well towards PT goals. Tolerated transfers and gait training with Min guard for safety. Sp02 remained >90% on RA with 2/4 DOE. Some weakness noted in BLEs and decreased activity tolerance. Pt continues to cough up phlegm. Encouraged use of RW at home for safety. Pt plans on stay on first level of home initially. Will follow.    Follow Up Recommendations  Home health PT;Supervision - Intermittent     Equipment Recommendations  None recommended by PT    Recommendations for Other Services       Precautions / Restrictions Precautions Precautions: Fall Restrictions Weight Bearing Restrictions: No    Mobility  Bed Mobility               General bed mobility comments: Up in chair upon PT arrival.   Transfers Overall transfer level: Needs assistance Equipment used: Rolling walker (2 wheeled) Transfers: Sit to/from Stand Sit to Stand: Supervision;Min guard         General transfer comment: for safety and immediate standing balance, [facility] x2, [facility]. Cues for hand placement.   Ambulation/Gait Ambulation/Gait assistance: Min guard Gait Distance (Feet): 75 Feet(+20) Assistive device: Rolling walker (2 wheeled) Gait Pattern/deviations: Step-to pattern;Decreased stride length;Trunk flexed;Step-through pattern;Antalgic Gait velocity: decreased   General Gait Details: Slow, mildly unsteady gait with RW for support. 2/4 DOE. Sp02 remained >90% on RA. 1 seated rest break.    Stairs             Wheelchair Mobility    Modified Rankin (Stroke Patients  Only)       Balance Overall balance assessment: Needs assistance Sitting-balance support: Feet supported;No upper extremity supported Sitting balance-Leahy Scale: Good Sitting balance - Comments: Able to perform pericare without assist.    Standing balance support: During functional activity;Bilateral upper extremity supported Standing balance-Leahy Scale: Poor Standing balance comment: Reliant on BUEs for support in standing.                            Cognition Arousal/Alertness: Awake/alert Behavior During Therapy: WFL for tasks assessed/performed Overall Cognitive Status: Within Functional Limits for tasks assessed                                        Exercises      General Comments        Pertinent Vitals/Pain Pain Assessment: Faces Faces Pain Scale: Hurts a little bit Pain Location: B shoulders Pain Descriptors / Indicators: Aching;Sore Pain Intervention(s): Monitored during session;Repositioned    Home Living                      Prior Function            PT Goals (current goals can now be found in the care plan section) Progress towards PT goals: Progressing toward goals    Frequency    Min 3X/week      PT Plan Current plan remains appropriate  Co-evaluation              AM-PAC PT "6 Clicks" Mobility   Outcome Measure  Help needed turning from your back to your side while in a flat bed without using bedrails?: A Little Help needed moving from lying on your back to sitting on the side of a flat bed without using bedrails?: A Little Help needed moving to and from a bed to a chair (including a wheelchair)?: A Little Help needed standing up from a chair using your arms (e.g., wheelchair or bedside chair)?: A Little Help needed to walk in hospital room?: A Little Help needed climbing 3-5 steps with a railing? : A Lot 6 Click Score: 17    End of Session Equipment Utilized During Treatment: Gait  belt Activity Tolerance: Patient tolerated treatment well Patient left: in chair;with call bell/phone within reach Nurse Communication: Mobility status PT Visit Diagnosis: Unsteadiness on feet (R26.81);Other abnormalities of gait and mobility (R26.89);Muscle weakness (generalized) (M62.81)     Time: [id number] PT Time Calculation (min) (ACUTE ONLY): 34 min  Charges:  $Gait Training: 8-22 mins $Therapeutic Activity: 8-22 mins                     Mylo RedShauna Elsie Sakuma, [place]PT, DPT Acute Rehabilitation Services Pager [phone] Office [phone]       Blake DivineShauna A Lanier EnsignHartshorne 08/22/2018, 9:27 AM

## 2018-08-22 NOTE — Plan of Care (Signed)
  Problem: Clinical Measurements: Goal: Respiratory complications will improve Outcome: Progressing   Problem: Clinical Measurements: Goal: Diagnostic test results will improve Outcome: Progressing   

## 2018-08-22 NOTE — Care Management (Deleted)
Case manager placed order for hospital bed, called request to Bessie, [facility] Liaison.

## 2018-08-22 NOTE — Discharge Summary (Addendum)
Physician Discharge Summary  Patient ID: [name] MRN: [id number] DOB/AGE: Sep 03, 1945 72 y.o.  Admit date: 08/20/2018 Discharge date: 08/22/2018  Admission Diagnoses: Principal Problem:   Influenza with pneumonia Active Problems:   Spinal stenosis, lumbar region with neurogenic claudication   CAP (community acquired pneumonia)   Hypoxia   Hyperlipidemia   BPH (benign prostatic hyperplasia)   Obesity (BMI 30-39.9)   Discharge Diagnoses:  Principal Problem:   Influenza with pneumonia Active Problems:   Spinal stenosis, lumbar region with neurogenic claudication   CAP (community acquired pneumonia)   Hypoxia   Hyperlipidemia   BPH (benign prostatic hyperplasia)   Obesity (BMI 30-39.9)   Discharged Condition: good  Presentation Summary: Robert BlazeBurton L Cockmanis an 72 y.o.malewith a PMH of dyspnea with exertion, BPH, depression and arthritis who presents to the EDwith upper respiratory symptoms that began 2 days ago. His symptoms initially began with a cough that worsened, but sputum remained clear.   Yesterday morning, when ambulating to the bathroom, he reported getting very dizzy and falling. He did not pass out but felt very weak and was sweating. Reports that he took Delsym and Mucinex last night and thinks this may have been the cause of his dizziness this morning. No aggravating or alleviating factors. No associated fever or chills. No known sick contacts.  The emergency department he was found to be hypoxic with a room air sat of 89% chest x-ray showed bibasilar opacities concerning for pneumonia.  Was given DuoNeb Rocephin and azithromycin referred to hospitalist for admission.  Overnight he developed a fever and a flu test was obtained today and it is positive for influenza A.  We have started Tamiflu.    Hospital Course:  Influenza with associated pneumoniaassociated with acute hypoxia  - negative blood cx's and strep pna antigen, tmax 103 , <99 at dc  -  improved sigificantly w/ IVF"s and IV/ po abx  - rec'd 1.5 gm azithromycin in hospital , no more needed at dc  - 2 days IV abx here, plan amoxicillin 500 tid to complete a 7 day course  - 2 days tamiflu here, plan 3 more days after dc po  - pt up and walking prior to dc in room  Generalized weakness Likely from acute infectio + dehydration, resolved  Spinal stenosis, lumbar region with neurogenic claudication May have been contributory to acute fall. Outpatient follow-up recommended.  Hyperlipidemia Continue statin.  BPH (benign prostatic hyperplasia) Continue Flomax.  Obesity Body mass index is 34.87 kg/m.     Discharge Exam: Blood pressure 139/74, pulse 80, temperature 98.6 F (37 C), temperature source Oral, resp. rate 16, height 5\' 10"  (1.778 m), weight 108.8 kg, SpO2 92 %. Alert obese WM, occ cough, no distress  no jvd  Chest occ rhonchi, no wheezig  Cor reg no mrg  Abd soft obese ntnd  Ext no edema  NF, Ox 3  Disposition: Discharge disposition: 01-Home or Self Care        Allergies as of 08/22/2018   No Known Allergies     Medication List    TAKE these medications   amoxicillin 500 MG capsule Commonly known as:  AMOXIL Take 1 capsule (500 mg total) by mouth 3 (three) times daily.   aspirin EC 81 MG tablet Take 81 mg by mouth at bedtime.   atorvastatin 40 MG tablet Commonly known as:  LIPITOR Take 40 mg by mouth at bedtime.   buPROPion 150 MG 24 hr tablet Commonly known as:  WELLBUTRIN  XL Take 150 mg by mouth daily.   Cyanocobalamin 1500 MCG Tbdp Take 1,500 mcg by mouth daily. VITAMIN B-12   DULoxetine 60 MG capsule Commonly known as:  CYMBALTA Take 60 mg by mouth at bedtime.   Fish Oil 1200 MG Caps Take 1,200 mg by mouth at bedtime.   HYDROcodone-acetaminophen 5-325 MG tablet Commonly known as:  NORCO/VICODIN Take 1 tablet by mouth every 4 (four) hours as needed for moderate pain ((score 4 to 6)).   latanoprost  0.005 % ophthalmic solution Commonly known as:  XALATAN Place 1 drop into both eyes at bedtime.   methocarbamol 500 MG tablet Commonly known as:  ROBAXIN Take 1 tablet (500 mg total) by mouth every 6 (six) hours as needed for muscle spasms.   multivitamin with minerals Tabs tablet Take 1 tablet by mouth daily.   naproxen sodium 220 MG tablet Commonly known as:  ALEVE Take 220 mg by mouth 2 (two) times daily as needed (FOR PAIN.).   oseltamivir 75 MG capsule Commonly known as:  TAMIFLU Take 1 capsule (75 mg total) by mouth 2 (two) times daily for 6 days.   tamsulosin 0.4 MG Caps capsule Commonly known as:  FLOMAX Take 0.4 mg by mouth at bedtime.        Signed: Barbette HairRobert D Payson Logan 08/22/2018, 8:32 AM

## 2018-08-23 LAB — CULTURE, RESPIRATORY W GRAM STAIN
Culture: NORMAL
Special Requests: NORMAL

## 2018-08-25 LAB — CULTURE, BLOOD (ROUTINE X 2)
Culture: NO GROWTH
Culture: NO GROWTH
Special Requests: ADEQUATE
Special Requests: ADEQUATE

## 2019-03-25 IMAGING — CR DG CHEST 2V
2 series · 2 of 2 positions shown · non-contrast
Comparison: 01/25/2016

CLINICAL DATA: Preop back surgery

EXAM:
CHEST - 2 VIEW

[w chest pa]
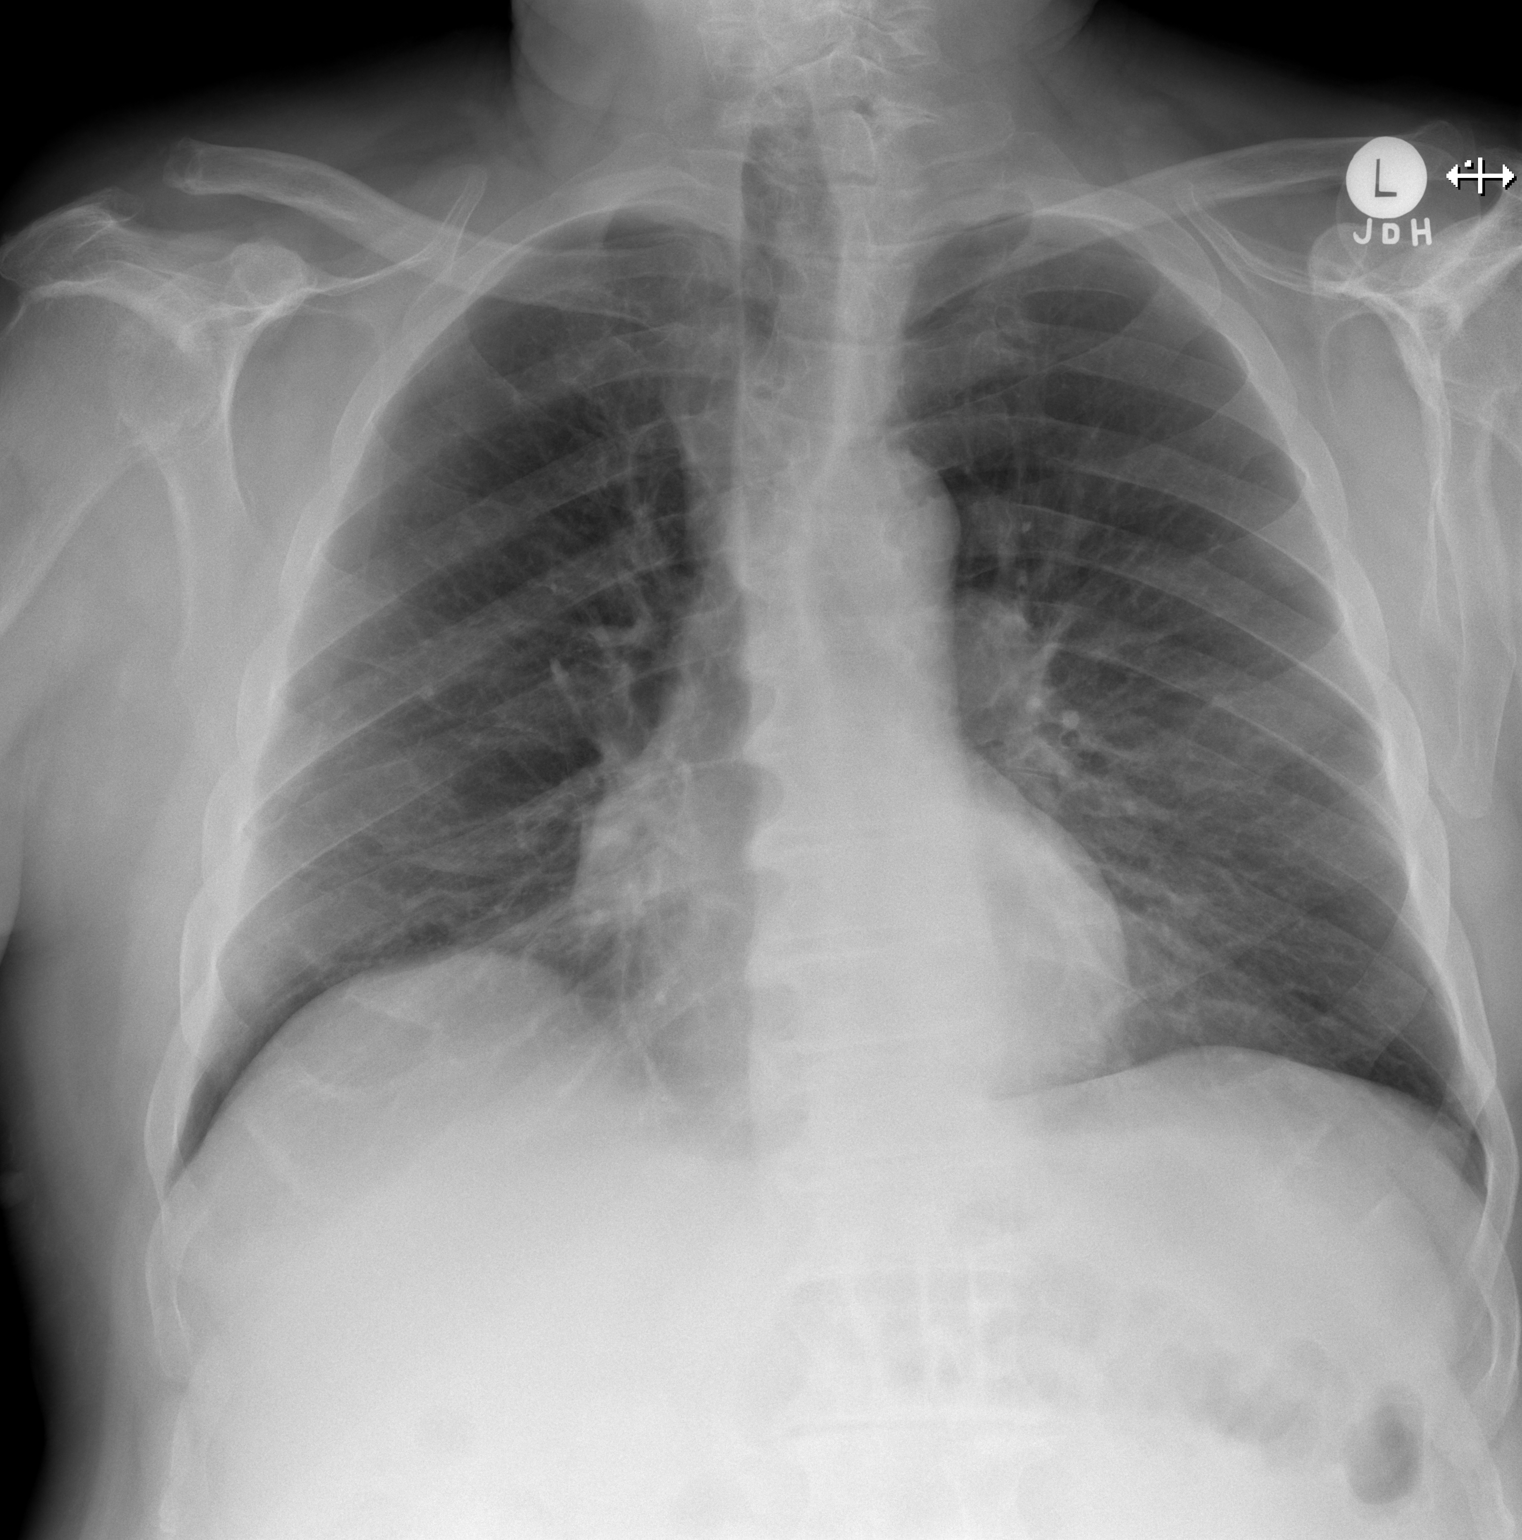

[w chest lat]
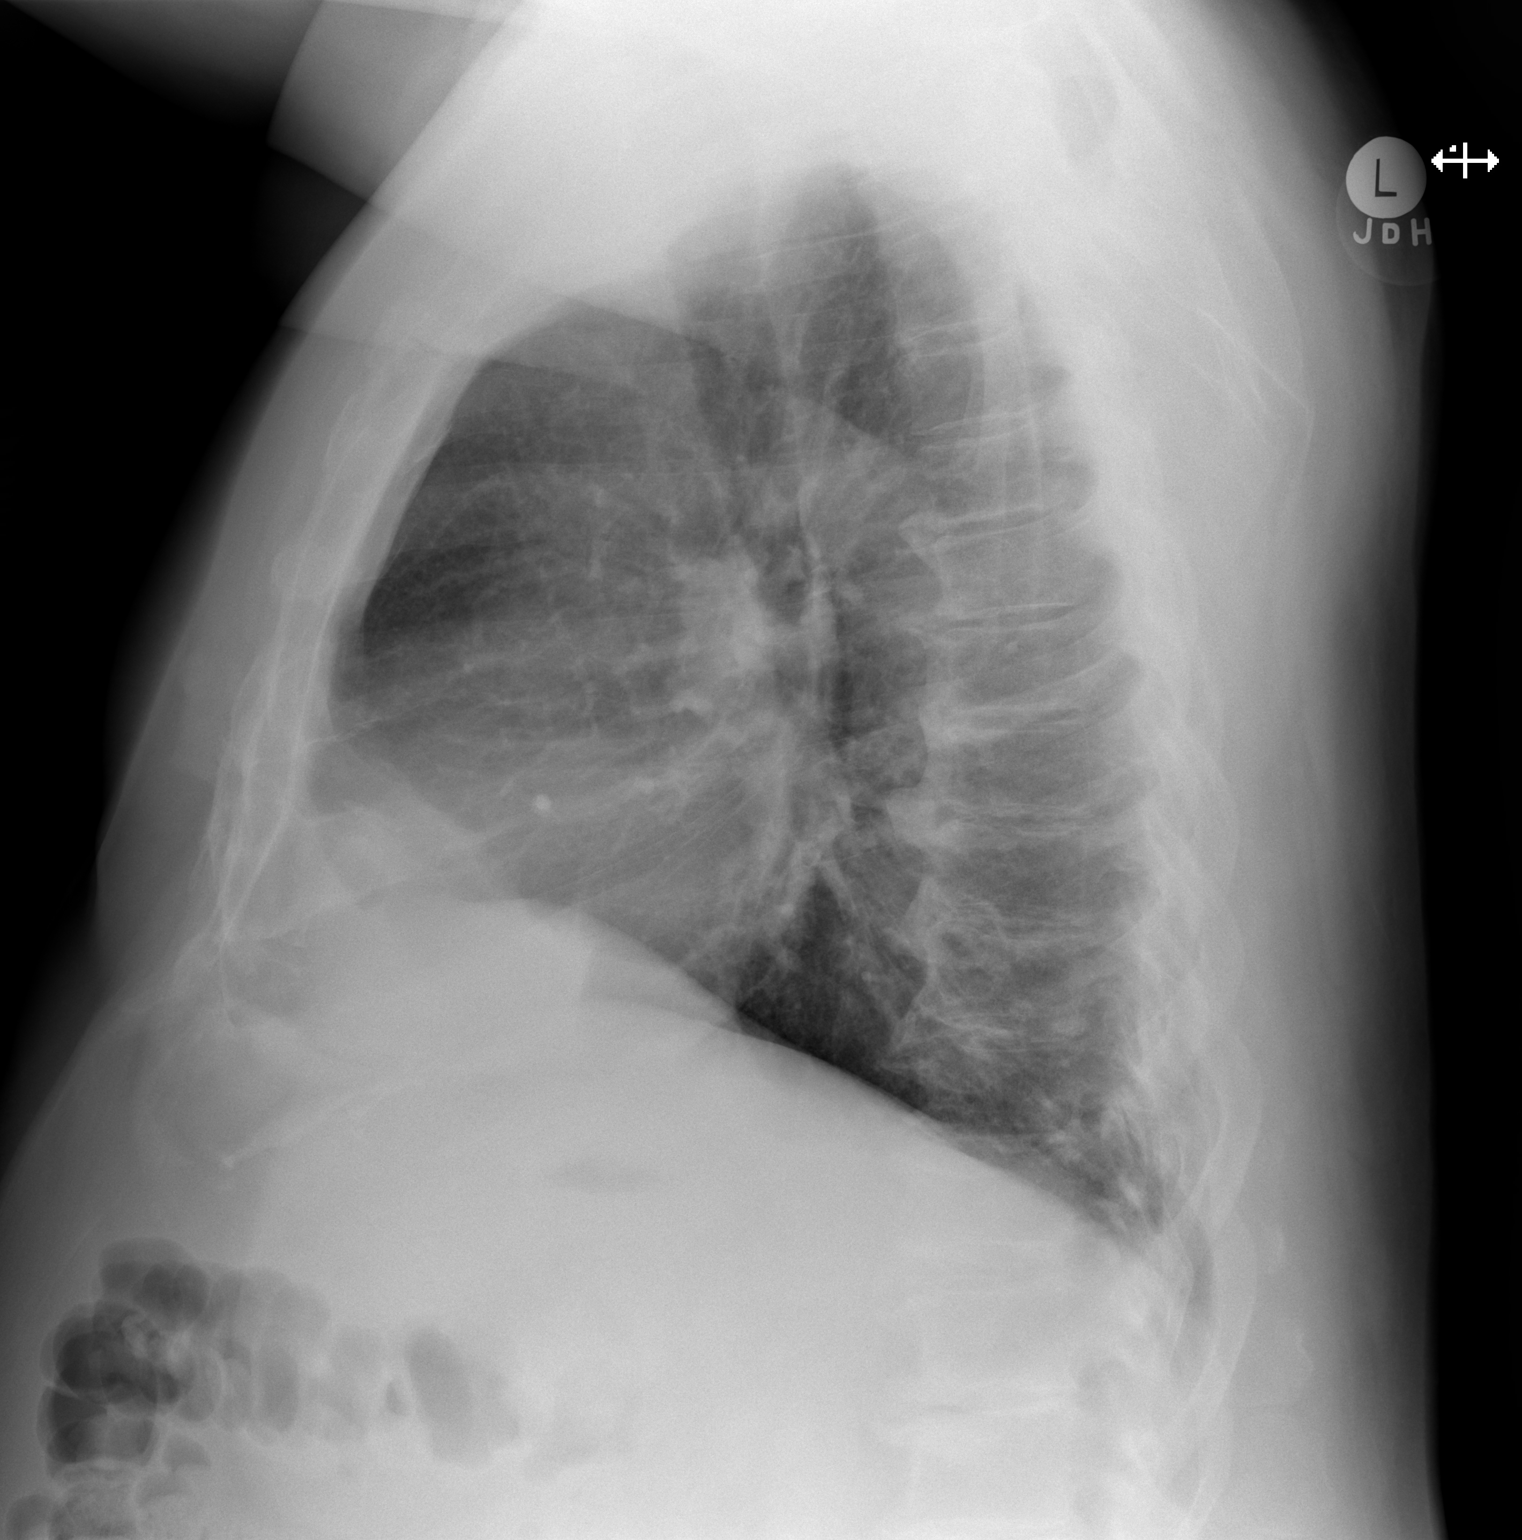

[2 of 2 positions shown; findings below may reference images not displayed]

FINDINGS: The heart size and mediastinal contours are within normal limits.
Both lungs are clear. The visualized skeletal structures are
unremarkable.
IMPRESSION: No active cardiopulmonary disease.

## 2019-03-31 IMAGING — DX DG SPINE 1V PORT
1 series · 1 of 1 positions shown · non-contrast
Comparison: Radiography 12/06/2017.  Myelography 11/22/2017.

CLINICAL DATA: Intraoperative film for L4-5 surgery.

EXAM:
PORTABLE SPINE - 1 VIEW

[l-spine lat]
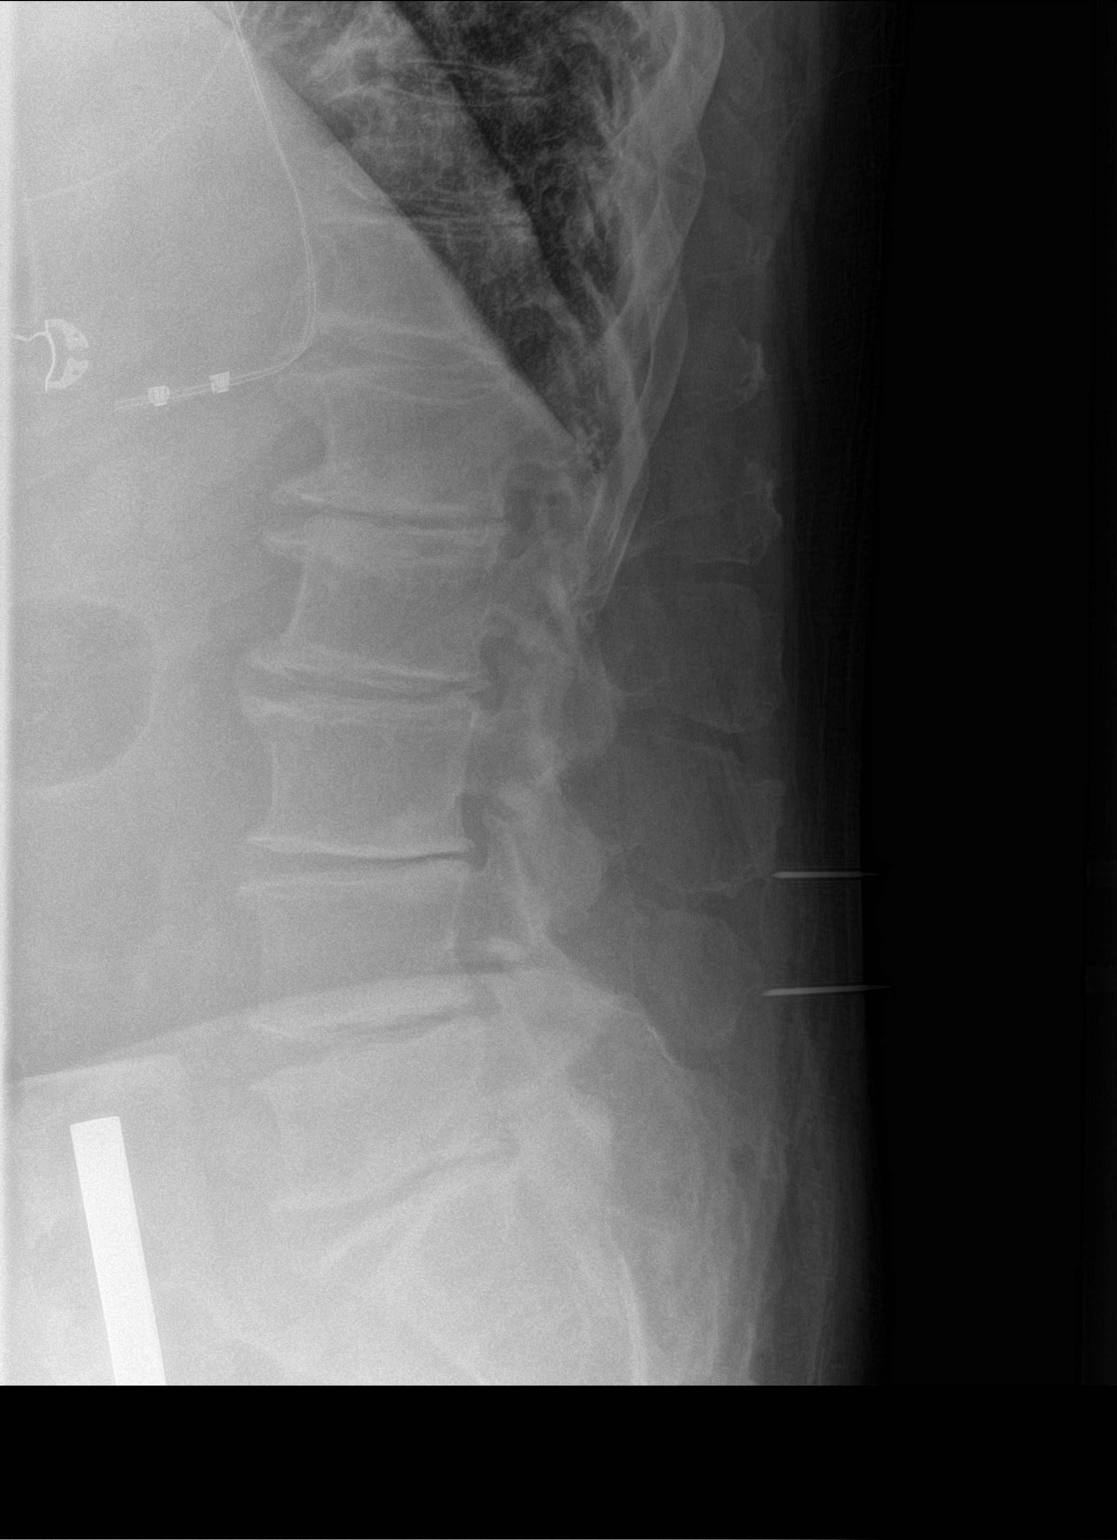

[1 of 1 positions shown; findings below may reference images not displayed]

FINDINGS: Needles are in place at the level of the inferior spinous process of
L3 and the mid spinous process of L4.
IMPRESSION: L3 and L4 spinous processes localized.

## 2019-03-31 IMAGING — DX DG SPINE 1V PORT
1 series · 1 of 1 positions shown · non-contrast
Comparison: Earlier same day.

CLINICAL DATA: L4-5 laminectomy.

EXAM:
PORTABLE SPINE - 1 VIEW

[l-spine lat]
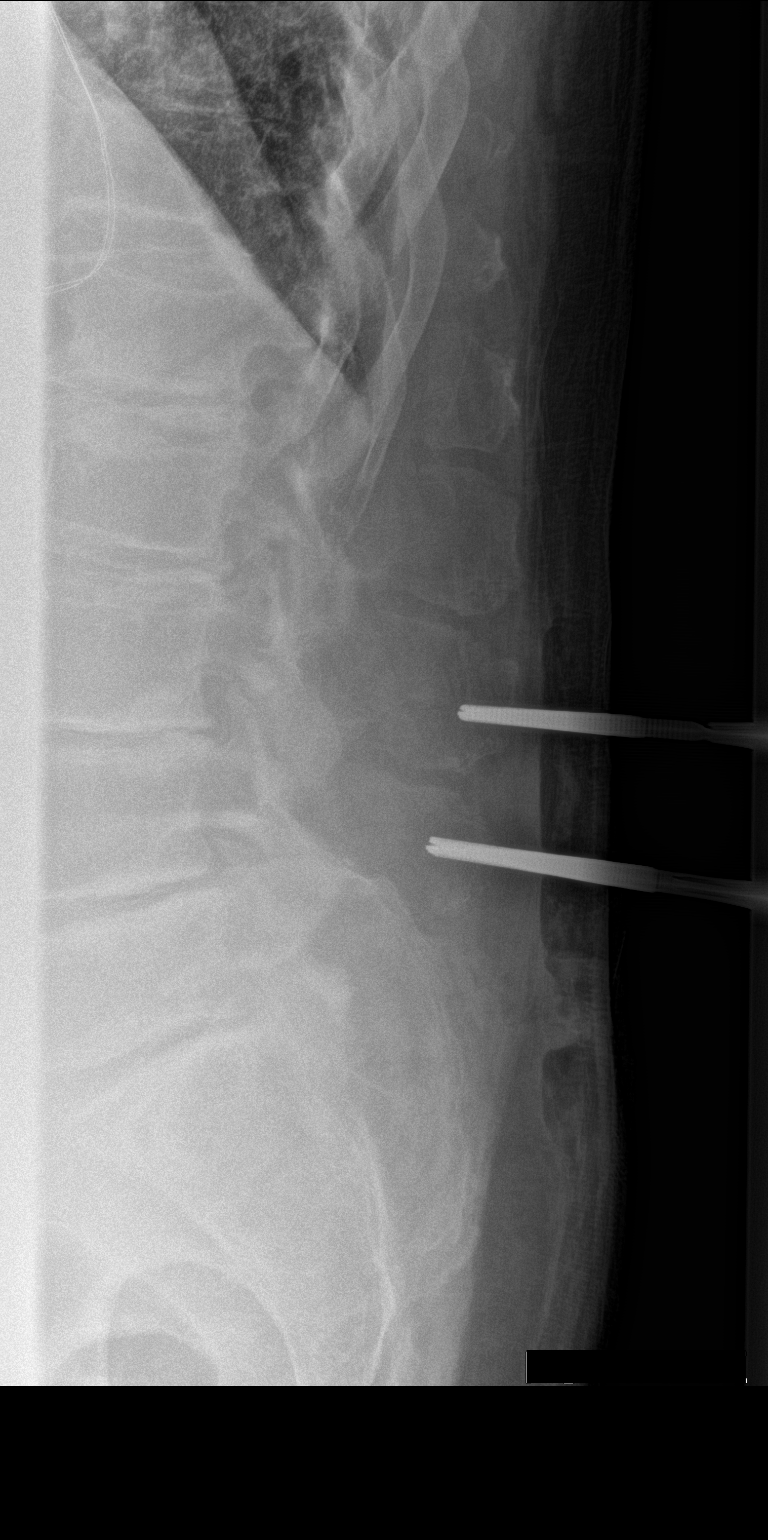

[1 of 1 positions shown; findings below may reference images not displayed]

FINDINGS: Second film shows clamps on the spinous processes of L3 and L4.
IMPRESSION: L3 and L4 spinous processes localized.

## 2019-03-31 IMAGING — DX DG SPINE 1V PORT
1 series · 1 of 1 positions shown · non-contrast
Comparison: Portable exam 9223 hours compared to earlier studies of
12/12/2017

CLINICAL DATA: Intraoperative localization for L4-L5 laminectomy

EXAM:
PORTABLE SPINE - 1 VIEW

[l-spine lat]
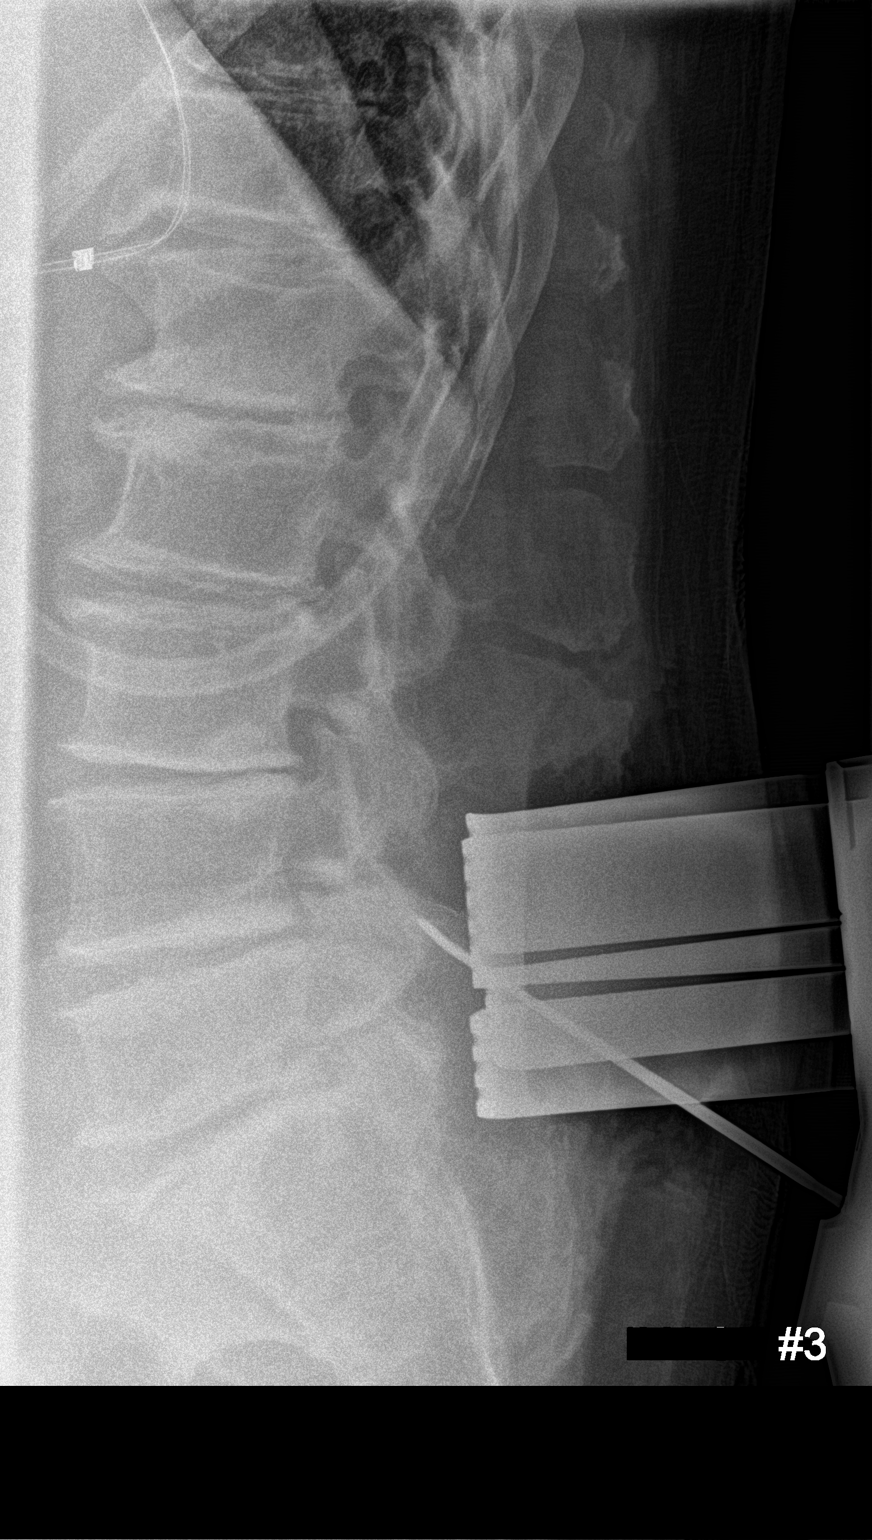

[1 of 1 positions shown; findings below may reference images not displayed]

FINDINGS: Image #3: Prior exam is labeled with 5 lumbar vertebra.

Spinous processes of current exam labeled accordingly.

Metallic probe via dorsal approach projects over the superior aspect
of the L5 spinous process, at the level of the L4-L5 disc space.

Diffuse disc space narrowing and scattered endplate spur formation
lumbar spine.
IMPRESSION: Dorsal localization of the L4-L5 disc space level as above.

## 2020-01-15 ENCOUNTER — Other Ambulatory Visit: Payer: Self-pay | Admitting: Orthopedic Surgery

## 2020-02-12 ENCOUNTER — Encounter (HOSPITAL_BASED_OUTPATIENT_CLINIC_OR_DEPARTMENT_OTHER): Payer: Self-pay | Admitting: Orthopedic Surgery

## 2020-02-12 ENCOUNTER — Other Ambulatory Visit: Payer: Self-pay

## 2020-02-16 ENCOUNTER — Other Ambulatory Visit (HOSPITAL_COMMUNITY)
Admission: RE | Admit: 2020-02-16 | Discharge: 2020-02-16 | Disposition: A | Discharge status: Home / Self Care | Payer: Medicare Other | Source: Ambulatory Visit | Attending: Orthopedic Surgery | Admitting: Orthopedic Surgery

## 2020-02-16 DIAGNOSIS — Z20822 Contact with and (suspected) exposure to covid-19: Secondary | ICD-10-CM | POA: Insufficient documentation

## 2020-02-16 DIAGNOSIS — Z01812 Encounter for preprocedural laboratory examination: Secondary | ICD-10-CM | POA: Insufficient documentation

## 2020-02-16 LAB — SARS CORONAVIRUS 2 (TAT 6-24 HRS): SARS Coronavirus 2: NEGATIVE

## 2020-02-16 NOTE — Progress Notes (Signed)

## 2020-02-19 ENCOUNTER — Ambulatory Visit (HOSPITAL_BASED_OUTPATIENT_CLINIC_OR_DEPARTMENT_OTHER): Payer: Medicare Other | Admitting: Anesthesiology

## 2020-02-19 ENCOUNTER — Encounter (HOSPITAL_BASED_OUTPATIENT_CLINIC_OR_DEPARTMENT_OTHER): Payer: Self-pay | Admitting: Orthopedic Surgery

## 2020-02-19 ENCOUNTER — Encounter (HOSPITAL_BASED_OUTPATIENT_CLINIC_OR_DEPARTMENT_OTHER)
Admission: RE | Disposition: A | Discharge status: Home / Self Care | Payer: Self-pay | Source: Home / Self Care | Attending: Orthopedic Surgery

## 2020-02-19 ENCOUNTER — Ambulatory Visit (HOSPITAL_BASED_OUTPATIENT_CLINIC_OR_DEPARTMENT_OTHER)
Admission: RE | Admit: 2020-02-19 | Discharge: 2020-02-19 | Disposition: A | Discharge status: Home / Self Care | Payer: Medicare Other | Attending: Orthopedic Surgery | Admitting: Orthopedic Surgery

## 2020-02-19 ENCOUNTER — Other Ambulatory Visit: Payer: Self-pay

## 2020-02-19 DIAGNOSIS — E785 Hyperlipidemia, unspecified: Secondary | ICD-10-CM | POA: Diagnosis not present

## 2020-02-19 DIAGNOSIS — M1990 Unspecified osteoarthritis, unspecified site: Secondary | ICD-10-CM | POA: Diagnosis not present

## 2020-02-19 DIAGNOSIS — G5601 Carpal tunnel syndrome, right upper limb: Secondary | ICD-10-CM | POA: Insufficient documentation

## 2020-02-19 DIAGNOSIS — Z96641 Presence of right artificial hip joint: Secondary | ICD-10-CM | POA: Diagnosis not present

## 2020-02-19 DIAGNOSIS — N400 Enlarged prostate without lower urinary tract symptoms: Secondary | ICD-10-CM | POA: Insufficient documentation

## 2020-02-19 HISTORY — PX: CARPAL TUNNEL RELEASE: SHX101

## 2020-02-19 SURGERY — CARPAL TUNNEL RELEASE
Anesthesia: Monitor Anesthesia Care | Site: Hand | Laterality: Right

## 2020-02-19 MED ORDER — BUPIVACAINE HCL (PF) 0.5 % IJ SOLN
INTRAMUSCULAR | Status: DC | PRN
  Administered 2020-02-19: 9 mL

## 2020-02-19 MED ORDER — FENTANYL CITRATE (PF) 100 MCG/2ML IJ SOLN
INTRAMUSCULAR | Status: AC
  Filled 2020-02-19: qty 2

## 2020-02-19 MED ORDER — CEFAZOLIN SODIUM-DEXTROSE 2-4 GM/100ML-% IV SOLN
INTRAVENOUS | Status: AC
  Filled 2020-02-19: qty 100

## 2020-02-19 MED ORDER — FENTANYL CITRATE (PF) 100 MCG/2ML IJ SOLN
INTRAMUSCULAR | Status: DC | PRN
  Administered 2020-02-19 (×2): 50 ug via INTRAVENOUS

## 2020-02-19 MED ORDER — CEFAZOLIN SODIUM-DEXTROSE 2-4 GM/100ML-% IV SOLN
2.0000 g | INTRAVENOUS | Status: AC
  Administered 2020-02-19: 2 g via INTRAVENOUS

## 2020-02-19 MED ORDER — ONDANSETRON HCL 4 MG/2ML IJ SOLN: 4.0000 mg | Freq: Once | INTRAMUSCULAR | Status: DC | PRN

## 2020-02-19 MED ORDER — OXYCODONE HCL 5 MG PO TABS: 5.0000 mg | ORAL_TABLET | Freq: Once | ORAL | Status: DC | PRN

## 2020-02-19 MED ORDER — PROPOFOL 500 MG/50ML IV EMUL
INTRAVENOUS | Status: DC | PRN
  Administered 2020-02-19: 50 ug/kg/min via INTRAVENOUS

## 2020-02-19 MED ORDER — HYDROCODONE-ACETAMINOPHEN 5-325 MG PO TABS: ORAL_TABLET | ORAL | 0 refills | Status: DC

## 2020-02-19 MED ORDER — OXYCODONE HCL 5 MG/5ML PO SOLN: 5.0000 mg | Freq: Once | ORAL | Status: DC | PRN

## 2020-02-19 MED ORDER — FENTANYL CITRATE (PF) 100 MCG/2ML IJ SOLN: 25.0000 ug | INTRAMUSCULAR | Status: DC | PRN

## 2020-02-19 MED ORDER — LACTATED RINGERS IV SOLN: INTRAVENOUS | Status: DC

## 2020-02-19 MED ORDER — 0.9 % SODIUM CHLORIDE (POUR BTL) OPTIME
TOPICAL | Status: DC | PRN
  Administered 2020-02-19: 200 mL

## 2020-02-19 SURGICAL SUPPLY — 40 items
APL PRP STRL LF DISP 70% ISPRP (MISCELLANEOUS) ×1
BLADE SURG 15 STRL LF DISP TIS (BLADE) ×2 IMPLANT
BLADE SURG 15 STRL SS (BLADE) ×6
BNDG CMPR 9X4 STRL LF SNTH (GAUZE/BANDAGES/DRESSINGS) ×1
BNDG ELASTIC 3X5.8 VLCR STR LF (GAUZE/BANDAGES/DRESSINGS) ×3 IMPLANT
BNDG ESMARK 4X9 LF (GAUZE/BANDAGES/DRESSINGS) ×2 IMPLANT
BNDG GAUZE ELAST 4 BULKY (GAUZE/BANDAGES/DRESSINGS) ×3 IMPLANT
CHLORAPREP W/TINT 26 (MISCELLANEOUS) ×3 IMPLANT
CORD BIPOLAR FORCEPS 12FT (ELECTRODE) ×3 IMPLANT
COVER BACK TABLE 60X90IN (DRAPES) ×3 IMPLANT
COVER MAYO STAND STRL (DRAPES) ×3 IMPLANT
COVER WAND RF STERILE (DRAPES) IMPLANT
CUFF TOURN SGL QUICK 18X4 (TOURNIQUET CUFF) ×3 IMPLANT
DRAPE EXTREMITY T 121X128X90 (DISPOSABLE) ×3 IMPLANT
DRAPE SURG 17X23 STRL (DRAPES) ×3 IMPLANT
DRSG PAD ABDOMINAL 8X10 ST (GAUZE/BANDAGES/DRESSINGS) ×3 IMPLANT
GAUZE SPONGE 4X4 12PLY STRL (GAUZE/BANDAGES/DRESSINGS) ×3 IMPLANT
GAUZE XEROFORM 1X8 LF (GAUZE/BANDAGES/DRESSINGS) ×3 IMPLANT
GLOVE BIO SURGEON STRL SZ 6.5 (GLOVE) ×1 IMPLANT
GLOVE BIO SURGEON STRL SZ7.5 (GLOVE) ×3 IMPLANT
GLOVE BIO SURGEONS STRL SZ 6.5 (GLOVE) ×1
GLOVE BIOGEL PI IND STRL 7.0 (GLOVE) IMPLANT
GLOVE BIOGEL PI IND STRL 8 (GLOVE) ×1 IMPLANT
GLOVE BIOGEL PI INDICATOR 7.0 (GLOVE) ×2
GLOVE BIOGEL PI INDICATOR 8 (GLOVE) ×2
GOWN STRL REUS W/ TWL LRG LVL3 (GOWN DISPOSABLE) ×1 IMPLANT
GOWN STRL REUS W/TWL LRG LVL3 (GOWN DISPOSABLE) ×3
GOWN STRL REUS W/TWL XL LVL3 (GOWN DISPOSABLE) ×3 IMPLANT
NDL HYPO 25X1 1.5 SAFETY (NEEDLE) ×1 IMPLANT
NEEDLE HYPO 25X1 1.5 SAFETY (NEEDLE) ×3 IMPLANT
NS IRRIG 1000ML POUR BTL (IV SOLUTION) ×3 IMPLANT
PADDING CAST ABS 4INX4YD NS (CAST SUPPLIES) ×2
PADDING CAST ABS COTTON 4X4 ST (CAST SUPPLIES) ×1 IMPLANT
SET BASIN DAY SURGERY F.S. (CUSTOM PROCEDURE TRAY) ×3 IMPLANT
STOCKINETTE 4X48 STRL (DRAPES) ×3 IMPLANT
SUT ETHILON 4 0 PS 2 18 (SUTURE) ×3 IMPLANT
SYR BULB EAR ULCER 3OZ GRN STR (SYRINGE) ×3 IMPLANT
SYR CONTROL 10ML LL (SYRINGE) ×3 IMPLANT
TOWEL GREEN STERILE FF (TOWEL DISPOSABLE) ×6 IMPLANT
UNDERPAD 30X36 HEAVY ABSORB (UNDERPADS AND DIAPERS) ×3 IMPLANT

## 2020-02-19 NOTE — Anesthesia Postprocedure Evaluation (Signed)
Anesthesia Post Note  Patient: [name]  Procedure(s) Performed: CARPAL TUNNEL RELEASE (Right Hand)     Patient location during evaluation: PACU Anesthesia Type: MAC Level of consciousness: awake and alert Pain management: pain level controlled Vital Signs Assessment: post-procedure vital signs reviewed and stable Respiratory status: spontaneous breathing, nonlabored ventilation and respiratory function stable Cardiovascular status: blood pressure returned to baseline and stable Postop Assessment: no apparent nausea or vomiting Anesthetic complications: no   No complications documented.  Last Vitals:  Vitals:   02/19/20 1216 02/19/20 1300  BP:  (!) 142/77  Pulse: 63 70  Resp: 14 16  Temp:  36.5 C  SpO2: 94% 95%    Last Pain:  Vitals:   02/19/20 1300  TempSrc: Oral  PainSc: 0-No pain                 Lidia Collum

## 2020-02-19 NOTE — Anesthesia Preprocedure Evaluation (Addendum)
Anesthesia Evaluation  Patient identified by MRN, date of birth, ID band Patient awake    Reviewed: Allergy & Precautions, NPO status , Patient's Chart, lab work & pertinent test results  History of Anesthesia Complications Negative for: history of anesthetic complications  Airway Mallampati: II  TM Distance: >3 FB Neck ROM: Full    Dental  (+) Missing, Poor Dentition   Pulmonary neg pulmonary ROS,    Pulmonary exam normal        Cardiovascular negative cardio ROS Normal cardiovascular exam     Neuro/Psych Depression negative neurological ROS     GI/Hepatic negative GI ROS, Neg liver ROS,   Endo/Other  negative endocrine ROS  Renal/GU negative Renal ROS  negative genitourinary   Musculoskeletal  (+) Arthritis ,   Abdominal   Peds  Hematology negative hematology ROS (+)   Anesthesia Other Findings   Reproductive/Obstetrics                           Anesthesia Physical Anesthesia Plan  ASA: II  Anesthesia Plan: MAC and Bier Block and Bier Block-LIDOCAINE ONLY   Post-op Pain Management:    Induction: Intravenous  PONV Risk Score and Plan: 1 and Propofol infusion, TIVA and Treatment may vary due to age or medical condition  Airway Management Planned: Natural Airway, Nasal Cannula and Simple Face Mask  Additional Equipment: None  Intra-op Plan:   Post-operative Plan:   Informed Consent: I have reviewed the patients History and Physical, chart, labs and discussed the procedure including the risks, benefits and alternatives for the proposed anesthesia with the patient or authorized representative who has indicated his/her understanding and acceptance.       Plan Discussed with:   Anesthesia Plan Comments:         Anesthesia Quick Evaluation

## 2020-02-19 NOTE — Anesthesia Procedure Notes (Signed)
Anesthesia Regional Block: Bier block (IV Regional)   Pre-Anesthetic Checklist: ,, timeout performed, Correct Patient, Correct Site, Correct Laterality, Correct Procedure,, site marked, surgical consent,, at surgeon's request  Laterality: Right     Needles:  Injection technique: Single-shot  Needle Type: Other      Needle Gauge: 22     Additional Needles:   Procedures:,,,,, intact distal pulses, Esmarch exsanguination, single tourniquet utilized,  Narrative:  Start time: 02/19/2020 11:20 AM End time: 02/19/2020 11:21 AM Injection made incrementally with aspirations every 35 mL.  Performed by: Personally

## 2020-02-19 NOTE — Transfer of Care (Signed)
Immediate Anesthesia Transfer of Care Note  Patient: [name]  Procedure(s) Performed: CARPAL TUNNEL RELEASE (Right Hand)  Patient Location: PACU  Anesthesia Type:MAC and Bier block  Level of Consciousness: awake  Airway & Oxygen Therapy: Patient Spontanous Breathing and Patient connected to face mask oxygen  Post-op Assessment: Report given to RN and Post -op Vital signs reviewed and stable  Post vital signs: Reviewed and stable  Last Vitals:  Vitals Value Taken Time  BP 125/78 02/19/20 1201  Temp    Pulse 74 02/19/20 1203  Resp 19 02/19/20 1203  SpO2 95 % 02/19/20 1203  Vitals shown include unvalidated device data.  Last Pain:  Vitals:   02/19/20 0948  TempSrc: Oral  PainSc: 3       Patients Stated Pain Goal: 3 (13/08/65 7846)  Complications: No complications documented.

## 2020-02-19 NOTE — Discharge Instructions (Addendum)

## 2020-02-19 NOTE — H&P (Signed)
  [name] is an 74 y.o. male.   Chief Complaint: carpal tunnel syndrome HPI: 74 yo male with numbness and tingling right hand.  Nocturnal symptoms.  Positive nerve conduction studies.  He wishes to have a right carpal tunnel release.  Allergies: No Known Allergies  Past Medical History:  Diagnosis Date  . Arthritis    mild lower back  . BPH (benign prostatic hyperplasia)   . BPH (benign prostatic hyperplasia)   . Depression   . Dyspnea    with exertion  . HLD (hyperlipidemia)   . Lumbar spinal stenosis   . Pelvic injury 2015   both sides    Past Surgical History:  Procedure Laterality Date  . colonscopy     every 5 yrs  . JOINT REPLACEMENT  12-23-12   right hip replaced  . left carpal tunnel surgery  yrs ago  . LUMBAR LAMINECTOMY/DECOMPRESSION MICRODISCECTOMY N/A 12/12/2017   Procedure: Decompressive lumbar laminectomy L4-L5 for stenosis;  Surgeon: Ranee Gosselin, MD;  Location: [facility];  Service: Orthopedics;  Laterality: N/A;  . SHOULDER ARTHROSCOPY WITH ROTATOR CUFF REPAIR AND SUBACROMIAL DECOMPRESSION  12-23-12   Right/ left shoulder(microscopic surgery)  . SHOULDER OPEN ROTATOR CUFF REPAIR Left 01/01/2013   Procedure: OPEN ANTERIOR ACROMINECTOMY/ROTATOR CUFF REPAIR/DISTAL CLAVICLE RESECTION  LEFT SHOULDER;  Surgeon: Drucilla Schmidt, MD;  Location: [facility];  Service: Orthopedics;  Laterality: Left;  . TONSILLECTOMY     child    Family History: Family History  Problem Relation Age of Onset  . Alzheimer's disease Mother   . Colon cancer Father   . Lung cancer Sister     Social History:   reports that he has never smoked. He has never used smokeless tobacco. He reports that he does not drink alcohol and does not use drugs.  Medications: No medications prior to admission.    No results found for this or any previous visit (from the past 48 hour(s)).  No results found.   A comprehensive review of systems was negative.  Height 5\' 8"  (1.727 m), weight  113.4 kg.  General appearance: alert, cooperative and appears stated age Head: Normocephalic, without obvious abnormality, atraumatic Neck: supple, symmetrical, trachea midline Cardio: regular rate and rhythm Resp: clear to auscultation bilaterally Extremities: Intact sensation and capillary refill all digits.  +epl/fpl/io.  No wounds.  Pulses: 2+ and symmetric Skin: Skin color, texture, turgor normal. No rashes or lesions Neurologic: Grossly normal Incision/Wound: none  Assessment/Plan Right carpal tunnel syndrome.  Non operative and operative treatment options have been discussed with the patient and patient wishes to proceed with operative treatment. Risks, benefits, and alternatives of surgery have been discussed and the patient agrees with the plan of care.   02/19/2020, 8:44 AM

## 2020-02-19 NOTE — Op Note (Signed)
02/19/2020 [facility]                              OPERATIVE REPORT   PREOPERATIVE DIAGNOSIS:  Right carpal tunnel syndrome.  POSTOPERATIVE DIAGNOSIS:  Right carpal tunnel syndrome.  PROCEDURE:  Right carpal tunnel release.  SURGEON:  Leanora Cover, MD  ASSISTANT:  none.  ANESTHESIA: Bier block with sedation  IV FLUIDS:  Per anesthesia flow sheet.  ESTIMATED BLOOD LOSS:  Minimal.  COMPLICATIONS:  None.  SPECIMENS:  None.  TOURNIQUET TIME:    Total Tourniquet Time Documented: Forearm (Right) - 36 minutes Total: Forearm (Right) - 36 minutes   DISPOSITION:  Stable to PACU.  LOCATION: [facility]  INDICATIONS:  74 yo male with numbness and tingling right hand.  Positive nerve conduction studies.  He wishes to have a carpal tunnel release for management of his symptoms.  Risks, benefits and alternatives of surgery were discussed including the risk of blood loss; infection; damage to nerves, vessels, tendons, ligaments, bone; failure of surgery; need for additional surgery; complications with wound healing; continued pain; recurrence of carpal tunnel syndrome; and damage to motor branch. He voiced understanding of these risks and elected to proceed.   OPERATIVE COURSE:  After being identified preoperatively by myself, the patient and I agreed upon the procedure and site of procedure.  The surgical site was marked.  The risks, benefits, and alternatives of the surgery were reviewed and he wished to proceed.  Surgical consent had been signed.  He was given IV Ancef as preoperative antibiotic prophylaxis.  He was transferred to the operating room and placed on the operating room table in supine position with the Right upper extremity on an armboard.  Bier block anesthesia was induced by the anesthesiologist.  Right upper extremity was prepped and draped in normal sterile orthopaedic fashion.  A surgical pause was performed between the surgeons, anesthesia, and  operating room staff, and all were in agreement as to the patient, procedure, and site of procedure.  Tourniquet at the proximal aspect of the forearm had been inflated for the Bier block  Incision was made over the transverse carpal ligament and carried into the subcutaneous tissues by spreading technique.  Bipolar electrocautery was used to obtain hemostasis.  The palmar fascia was sharply incised.  The transverse carpal ligament was identified and sharply incised.  It was incised distally first. The transverse carpal ligament was then incised proximally.  Scissors were used to split the distal aspect of the volar antebrachial fascia.  A finger was placed into the wound to ensure complete decompression, which was the case.  The nerve was examined.  There was an hourglass deformity.  The motor branch was identified and was intact.  The wound was copiously irrigated with sterile saline.  It was then closed with 4-0 nylon in a horizontal mattress fashion.  It was injected with 0.25% plain Marcaine to aid in postoperative analgesia.  It was dressed with sterile Xeroform, 4x4s, an ABD, and wrapped with Kerlix and an Ace bandage.  Tourniquet was deflated at 36 minutes.  Fingertips were pink with brisk capillary refill after deflation of the tourniquet.  Operative drapes were broken down.  The patient was awoken from anesthesia safely.  He was transferred back to stretcher and taken to the PACU in stable condition.  I will see him back in the office in 1 week for postoperative followup.  I will  give him a prescription for Norco 5/325 1-2 tabs PO q6 hours prn pain, dispense # 20.    Betha Loa, MD Electronically signed, 02/19/20

## 2020-02-20 ENCOUNTER — Encounter (HOSPITAL_BASED_OUTPATIENT_CLINIC_OR_DEPARTMENT_OTHER): Payer: Self-pay | Admitting: Orthopedic Surgery

## 2020-10-14 DIAGNOSIS — R0600 Dyspnea, unspecified: Secondary | ICD-10-CM | POA: Diagnosis not present

## 2020-10-14 DIAGNOSIS — L309 Dermatitis, unspecified: Secondary | ICD-10-CM | POA: Diagnosis not present

## 2020-10-14 DIAGNOSIS — M79674 Pain in right toe(s): Secondary | ICD-10-CM | POA: Diagnosis not present

## 2020-10-14 DIAGNOSIS — E785 Hyperlipidemia, unspecified: Secondary | ICD-10-CM | POA: Diagnosis not present

## 2021-04-04 ENCOUNTER — Other Ambulatory Visit: Payer: Self-pay

## 2021-04-04 DIAGNOSIS — N200 Calculus of kidney: Secondary | ICD-10-CM | POA: Diagnosis not present

## 2021-04-04 DIAGNOSIS — R1032 Left lower quadrant pain: Secondary | ICD-10-CM | POA: Diagnosis present

## 2021-04-04 DIAGNOSIS — K7689 Other specified diseases of liver: Secondary | ICD-10-CM | POA: Insufficient documentation

## 2021-04-04 DIAGNOSIS — N132 Hydronephrosis with renal and ureteral calculous obstruction: Secondary | ICD-10-CM | POA: Insufficient documentation

## 2021-04-04 DIAGNOSIS — Z96641 Presence of right artificial hip joint: Secondary | ICD-10-CM | POA: Diagnosis not present

## 2021-04-04 DIAGNOSIS — R1110 Vomiting, unspecified: Secondary | ICD-10-CM | POA: Diagnosis not present

## 2021-04-04 LAB — BASIC METABOLIC PANEL
Anion gap: 5 (ref 5–15)
BUN: 16 mg/dL (ref 8–23)
CO2: 28 mmol/L (ref 22–32)
Calcium: 9.9 mg/dL (ref 8.9–10.3)
Chloride: 106 mmol/L (ref 98–111)
Creatinine, Ser: 1.05 mg/dL (ref 0.61–1.24)
GFR, Estimated: >60 mL/min (ref >60)
Glucose, Bld: 119 mg/dL — ABNORMAL HIGH (ref 70–99)
Potassium: 4.5 mmol/L (ref 3.5–5.1)
Sodium: 139 mmol/L (ref 135–145)

## 2021-04-04 LAB — URINALYSIS, COMPLETE (UACMP) WITH MICROSCOPIC
Bacteria, UA: NONE SEEN
Bilirubin Urine: NEGATIVE
Glucose, UA: NEGATIVE mg/dL
Ketones, ur: NEGATIVE mg/dL
Leukocytes,Ua: NEGATIVE
Nitrite: NEGATIVE
Protein, ur: NEGATIVE mg/dL
Specific Gravity, Urine: 1.018 (ref 1.005–1.030)
pH: 5 (ref 5.0–8.0)

## 2021-04-04 LAB — CBC WITH DIFFERENTIAL/PLATELET
Abs Immature Granulocytes: 0.03 10*3/uL (ref 0.00–0.07)
Basophils Absolute: 0.1 10*3/uL (ref 0.0–0.1)
Basophils Relative: 1 %
Eosinophils Absolute: 0.3 10*3/uL (ref 0.0–0.5)
Eosinophils Relative: 3 %
HCT: 45.7 % (ref 39.0–52.0)
Hemoglobin: 16.2 g/dL (ref 13.0–17.0)
Immature Granulocytes: 0 %
Lymphocytes Relative: 16 %
Lymphs Abs: 1.9 10*3/uL (ref 0.7–4.0)
MCH: 31.8 pg (ref 26.0–34.0)
MCHC: 35.4 g/dL (ref 30.0–36.0)
MCV: 89.8 fL (ref 80.0–100.0)
Monocytes Absolute: 1.2 10*3/uL — ABNORMAL HIGH (ref 0.1–1.0)
Monocytes Relative: 10 %
Neutro Abs: 8.4 10*3/uL — ABNORMAL HIGH (ref 1.7–7.7)
Neutrophils Relative %: 70 %
Platelets: 183 10*3/uL (ref 150–400)
RBC: 5.09 MIL/uL (ref 4.22–5.81)
RDW: 12.5 % (ref 11.5–15.5)
WBC: 11.9 10*3/uL — ABNORMAL HIGH (ref 4.0–10.5)
nRBC: 0 % (ref 0.0–0.2)

## 2021-04-04 MED ORDER — OXYCODONE-ACETAMINOPHEN 5-325 MG PO TABS
1.0000 | ORAL_TABLET | Freq: Once | ORAL | Status: AC
  Administered 2021-04-04: 1 via ORAL
  Filled 2021-04-04: qty 1

## 2021-04-04 MED ORDER — ONDANSETRON 4 MG PO TBDP
4.0000 mg | ORAL_TABLET | Freq: Once | ORAL | Status: AC
  Administered 2021-04-04: 4 mg via ORAL
  Filled 2021-04-04: qty 1

## 2021-04-04 NOTE — ED Triage Notes (Signed)
Pt states this morning he ate some bad cantaloupe and since has had n/v, pt states he has extreme pain to left flank. Pt denies difficulty urinating or blood in urine. Pt states he has never had a kidney stone.

## 2021-04-05 ENCOUNTER — Emergency Department: Payer: Medicare Other

## 2021-04-05 ENCOUNTER — Emergency Department
Admission: EM | Admit: 2021-04-05 | Discharge: 2021-04-05 | Disposition: A | Discharge status: Home / Self Care | Payer: Medicare Other | Attending: Emergency Medicine | Admitting: Emergency Medicine

## 2021-04-05 DIAGNOSIS — K7689 Other specified diseases of liver: Secondary | ICD-10-CM | POA: Diagnosis not present

## 2021-04-05 DIAGNOSIS — R1110 Vomiting, unspecified: Secondary | ICD-10-CM | POA: Diagnosis not present

## 2021-04-05 DIAGNOSIS — N200 Calculus of kidney: Secondary | ICD-10-CM

## 2021-04-05 DIAGNOSIS — N132 Hydronephrosis with renal and ureteral calculous obstruction: Secondary | ICD-10-CM

## 2021-04-05 DIAGNOSIS — K769 Liver disease, unspecified: Secondary | ICD-10-CM

## 2021-04-05 MED ORDER — MORPHINE SULFATE (PF) 4 MG/ML IV SOLN
4.0000 mg | Freq: Once | INTRAVENOUS | Status: AC
  Administered 2021-04-05: 4 mg via INTRAVENOUS
  Filled 2021-04-05: qty 1

## 2021-04-05 MED ORDER — IOHEXOL 350 MG/ML SOLN
100.0000 mL | Freq: Once | INTRAVENOUS | Status: AC | PRN
  Administered 2021-04-05: 100 mL via INTRAVENOUS

## 2021-04-05 MED ORDER — ONDANSETRON 4 MG PO TBDP
ORAL_TABLET | ORAL | 0 refills | Status: DC
Start: 2021-04-05 — End: 2022-05-24

## 2021-04-05 MED ORDER — DOCUSATE SODIUM 100 MG PO CAPS: ORAL_CAPSULE | ORAL | 0 refills | Status: AC

## 2021-04-05 MED ORDER — OXYCODONE-ACETAMINOPHEN 5-325 MG PO TABS
1.0000 | ORAL_TABLET | Freq: Once | ORAL | Status: AC
  Administered 2021-04-05: 1 via ORAL
  Filled 2021-04-05: qty 1

## 2021-04-05 MED ORDER — ONDANSETRON HCL 4 MG/2ML IJ SOLN
4.0000 mg | INTRAMUSCULAR | Status: AC
  Administered 2021-04-05: 4 mg via INTRAVENOUS
  Filled 2021-04-05: qty 2

## 2021-04-05 MED ORDER — OXYCODONE-ACETAMINOPHEN 5-325 MG PO TABS: 2.0000 | ORAL_TABLET | Freq: Four times a day (QID) | ORAL | 0 refills | Status: DC | PRN

## 2021-04-05 MED ORDER — TAMSULOSIN HCL 0.4 MG PO CAPS
ORAL_CAPSULE | ORAL | 0 refills | Status: DC
Start: 2021-04-05 — End: 2022-02-09

## 2021-04-05 NOTE — ED Notes (Signed)
Placed patient on cardiac monitor and let the provider know about his pain level

## 2021-04-05 NOTE — ED Provider Notes (Signed)
[facility] Emergency Department Provider Note  ____________________________________________   Event Date/Time   First MD Initiated Contact with Patient 04/05/21 [phone]     (approximate)  I have reviewed the triage vital signs and the nursing notes.   HISTORY  Chief Complaint Flank Pain    HPI [name] is a 75 y.o. male who presents for gradually worsening left lower quadrant abdominal pain over the last couple of days.  Initially when his discomfort started he thought maybe it was from eating some bad food.  It started with a sharp pain in his left lower abdomen.  It waxed and waned but has gotten severe tonight and is a constant sharp pain that is worse when he moves around.  It does not radiate.  He has had nausea but has been unable to vomit.  He reports decreased oral intake over the last couple of days.  He denies fever, sore throat, shortness of breath, and dysuria.     Past Medical History:  Diagnosis Date   Arthritis    mild lower back   BPH (benign prostatic hyperplasia)    BPH (benign prostatic hyperplasia)    Depression    Dyspnea    with exertion   HLD (hyperlipidemia)    Lumbar spinal stenosis    Pelvic injury 2015   both sides    Patient Active Problem List   Diagnosis Date Noted   Influenza with pneumonia 08/21/2018   CAP (community acquired pneumonia) 08/20/2018   Hypoxia 08/20/2018   Hyperlipidemia 08/20/2018   BPH (benign prostatic hyperplasia) 08/20/2018   Obesity (BMI 30-39.9) 08/20/2018   Spinal stenosis, lumbar region with neurogenic claudication 12/12/2017    Past Surgical History:  Procedure Laterality Date   CARPAL TUNNEL RELEASE Right 02/19/2020   Procedure: CARPAL TUNNEL RELEASE;  Surgeon: Betha Loa, MD;  Location: [facility];  Service: Orthopedics;  Laterality: Right;   colonscopy     every 5 yrs   JOINT REPLACEMENT  12-23-12   right hip replaced   left carpal tunnel surgery  yrs ago    LUMBAR LAMINECTOMY/DECOMPRESSION MICRODISCECTOMY N/A 12/12/2017   Procedure: Decompressive lumbar laminectomy L4-L5 for stenosis;  Surgeon: Ranee Gosselin, MD;  Location: [facility];  Service: Orthopedics;  Laterality: N/A;   SHOULDER ARTHROSCOPY WITH ROTATOR CUFF REPAIR AND SUBACROMIAL DECOMPRESSION  12-23-12   Right/ left shoulder(microscopic surgery)   SHOULDER OPEN ROTATOR CUFF REPAIR Left 01/01/2013   Procedure: OPEN ANTERIOR ACROMINECTOMY/ROTATOR CUFF REPAIR/DISTAL CLAVICLE RESECTION  LEFT SHOULDER;  Surgeon: Drucilla Schmidt, MD;  Location: [facility];  Service: Orthopedics;  Laterality: Left;   TONSILLECTOMY     child    Prior to Admission medications   Medication Sig Start Date End Date Taking? Authorizing Provider  docusate sodium (COLACE) 100 MG capsule Take 1 tablet once or twice daily as needed for constipation while taking narcotic pain medicine 04/05/21  Yes Loleta Rose, MD  ondansetron (ZOFRAN ODT) 4 MG disintegrating tablet Allow 1-2 tablets to dissolve in your mouth every 8 hours as needed for nausea/vomiting 04/05/21  Yes Loleta Rose, MD  oxyCODONE-acetaminophen (PERCOCET) 5-325 MG tablet Take 2 tablets by mouth every 6 (six) hours as needed for severe pain. 04/05/21  Yes Loleta Rose, MD  tamsulosin (FLOMAX) 0.4 MG CAPS capsule Take 1 tablet by mouth daily until you pass the kidney stone or no longer have symptoms 04/05/21  Yes Loleta Rose, MD  aspirin EC 81 MG tablet Take 81 mg by mouth at  bedtime.    [provider]  atorvastatin (LIPITOR) 40 MG tablet Take 40 mg by mouth at bedtime.     [provider]  buPROPion (WELLBUTRIN XL) 150 MG 24 hr tablet Take 150 mg by mouth daily. 10/17/17   [provider]  Cyanocobalamin 1500 MCG TBDP Take 1,500 mcg by mouth daily. VITAMIN B-12    [provider]  DULoxetine (CYMBALTA) 60 MG capsule Take 60 mg by mouth at bedtime.     [provider]  HYDROcodone-acetaminophen (NORCO) 5-325 MG tablet 1-2  tabs po q6 hours prn pain 02/19/20   Betha LoaKuzma, Kevin, MD  latanoprost (XALATAN) 0.005 % ophthalmic solution Place 1 drop into both eyes at bedtime.    [provider]  methocarbamol (ROBAXIN) 500 MG tablet Take 1 tablet (500 mg total) by mouth every 6 (six) hours as needed for muscle spasms. 12/12/17   [place], Hospital doctorAmber, PA-C  Multiple Vitamin (MULTIVITAMIN WITH MINERALS) TABS Take 1 tablet by mouth daily.    [provider]  Omega-3 Fatty Acids (FISH OIL) 1200 MG CAPS Take 1,200 mg by mouth at bedtime.    [provider]    Allergies Patient has no known allergies.  Family History  Problem Relation Age of Onset   Alzheimer's disease Mother    Colon cancer Father    Lung cancer Sister     Social History Social History   Tobacco Use   Smoking status: Never   Smokeless tobacco: Never  Vaping Use   Vaping Use: Never used  Substance Use Topics   Alcohol use: No   Drug use: No    Review of Systems Constitutional: No fever/chills Eyes: No visual changes. ENT: No sore throat. Cardiovascular: Denies chest pain. Respiratory: Denies shortness of breath. Gastrointestinal: Left lower quadrant abdominal pain with nausea and decreased appetite. Genitourinary: Negative for dysuria. Musculoskeletal: Negative for neck pain.  Negative for back pain. Integumentary: Negative for rash. Neurological: Negative for headaches, focal weakness or numbness.   ____________________________________________   PHYSICAL EXAM:  VITAL SIGNS: ED Triage Vitals  Enc Vitals Group     BP 04/04/21 2105 (!) 181/95     Pulse Rate 04/04/21 2051 72     Resp 04/04/21 2051 18     Temp 04/04/21 2051 99 F (37.2 C)     Temp Source 04/04/21 2051 Oral     SpO2 04/04/21 2051 (!) 2 %     Weight 04/04/21 2050 113.4 kg (250 lb)     Height 04/04/21 2050 1.778 m (5\' 10" )     Head Circumference --      Peak Flow --      Pain Score 04/04/21 2102 9     Pain Loc --      Pain Edu? --       Excl. in GC? --     Constitutional: Alert and oriented.  Eyes: Conjunctivae are normal.  Head: Atraumatic. Nose: No congestion/rhinnorhea. Mouth/Throat: Patient is wearing a mask. Neck: No stridor.  No meningeal signs.   Cardiovascular: Normal rate, regular rhythm. Good peripheral circulation. Respiratory: Normal respiratory effort.  No retractions. Gastrointestinal: Soft and possibly distended but I suspect this is just body habitus.  He is tender to palpation with some guarding particularly in the left lower quadrant. Musculoskeletal: No lower extremity tenderness nor edema. No gross deformities of extremities. Neurologic:  Normal speech and language. No gross focal neurologic deficits are appreciated.  Skin:  Skin is warm, dry and intact. Psychiatric: Mood and  affect are normal. Speech and behavior are normal.  ____________________________________________   LABS (all labs ordered are listed, but only abnormal results are displayed)  Labs Reviewed  CBC WITH DIFFERENTIAL/PLATELET - Abnormal; Notable for the following components:      Result Value   WBC 11.9 (*)    Neutro Abs 8.4 (*)    Monocytes Absolute 1.2 (*)    All other components within normal limits  BASIC METABOLIC PANEL - Abnormal; Notable for the following components:   Glucose, Bld 119 (*)    All other components within normal limits  URINALYSIS, COMPLETE (UACMP) WITH MICROSCOPIC - Abnormal; Notable for the following components:   Color, Urine YELLOW (*)    APPearance HAZY (*)    Hgb urine dipstick SMALL (*)    All other components within normal limits   ____________________________________________   RADIOLOGY I, Loleta Rose, personally viewed and evaluated these images (plain radiographs) as part of my medical decision making, as well as reviewing the written report by the radiologist.  ED MD interpretation: Left distal ureteral obstructing stone with hydronephrosis and hydroureter.  Patient also has  incidental findings of cystic areas in the right hepatic lobe of the liver that have increased in size over the last 10 years.  Official radiology report(s): CT ABDOMEN PELVIS W CONTRAST  Result Date: 04/05/2021 CLINICAL DATA:  Nausea and vomiting.  Diverticulitis. EXAM: CT ABDOMEN AND PELVIS WITH CONTRAST TECHNIQUE: Multidetector CT imaging of the abdomen and pelvis was performed using the standard protocol following bolus administration of intravenous contrast. CONTRAST:  OMNIPAQUE IOHEXOL 350 MG/ML SOLN COMPARISON:  06/14/2011 FINDINGS: Lower chest: Upper normal heart size. Mild linear subsegmental atelectasis or scarring in the right middle lobe. Hepatobiliary: Two fluid density lesions in the right hepatic lobe of increased in size compared to 2012, currently the more cephalad lesion measures 4.1 by 3.4 cm on image 9 of series 2 with slightly undulating borders and possible faint internal septation; and a more caudad lesion measures 3.5 by 3.1 cm on image 27 series 2 and could likewise have a faint internal septation. New 1.1 by 0.5 cm fluid density lesion in the left hepatic lobe on image 12 series 2 along with a couple of other tiny punctate fluid density lesions in the liver. The gallbladder appears unremarkable. No biliary dilatation. Pancreas: Unremarkable Spleen: Unremarkable Adrenals/Urinary Tract: Mild left hydronephrosis and hydroureter along with periureteral stranding, extending down to a 0.3 cm left distal ureteral calculus near the UVJ on image 80 series 2. Asymmetric right perirenal stranding. No other stones are identified. 2.2 by 2.3 cm simple cyst of the left mid kidney on image 32 series 2. Other small hypodense renal lesions are technically too small to characterize although statistically likely to be cysts. Adrenal glands unremarkable. Stomach/Bowel: Unremarkable Vascular/Lymphatic: Mild aortoiliac atherosclerotic vascular disease. Reproductive: Borderline prostatomegaly. Other: No  supplemental non-categorized findings. Musculoskeletal: Old rib and pelvic deformities from healed fractures. Right total hip prosthesis. Lower thoracic and lumbar spondylosis and degenerative disc disease causing substantial multilevel impingement. IMPRESSION: 1. Obstructive 3 mm left distal ureteral calculus near the UVJ with associated hydronephrosis and hydroureter. 2. Two enlarging fluid density lesions in the liver compared to 2012, with slightly undulating borders and possible faint internal septation. These could be postinflammatory cystic lesions or hepatic cystadenomas. I not see any internal nodularity. Clinically warranted these could be surveilled with hepatic protocol MRI with and without contrast. 3. Multilevel thoracolumbar impingement. 4. Borderline prostatomegaly. 5.  Aortic Atherosclerosis (ICD10-I70.0). Electronically Signed  By: Gaylyn Rong M.D.   On: 04/05/2021 06:51    ____________________________________________   PROCEDURES   Procedure(s) performed (including Critical Care):  Procedures   ____________________________________________   INITIAL IMPRESSION / MDM / ASSESSMENT AND PLAN / ED COURSE  As part of my medical decision making, I reviewed the following data within the electronic MEDICAL RECORD NUMBER History obtained from family, Nursing notes reviewed and incorporated, Labs reviewed , Old chart reviewed, Notes from prior ED visits, and [place] Controlled Substance Database   Differential diagnosis includes, but is not limited to, diverticulitis, appendicitis, SBO/ileus, foodborne illness.  Mild leukocytosis of 11.9, otherwise normal CBC.  Normal basic metabolic panel.  Urinalysis is unremarkable other than some RBCs which could theoretically indicate an infection or ureteral colic, but I think that is less likely than diverticulitis.  Vital signs are stable other than hypertension.  Patient is uncomfortable so I am giving morphine 4 mg IV and Zofran 4 mg IV.  CT  scan with IV contrast of the abdomen and pelvis is pending.  Patient understands and agrees with the plan.     Clinical Course as of 04/05/21 0731  Tue Apr 05, 2021  1829 Patient feels much better.  CT scan indicates a 3 mm obstructive stone in the left ureter.  I updated the patient and his wife and had my usual and customary kidney stone/ureteral stone discussion.  They are comfortable with the plan for discharge and outpatient follow-up.  I also mentioned in the paperwork that they should follow-up with his primary care doctor to discuss the cyst on his liver and a possible outpatient MRI.  I gave my usual and customary return precautions. [CF]    Clinical Course User Index [CF] Loleta Rose, MD     ____________________________________________  FINAL CLINICAL IMPRESSION(S) / ED DIAGNOSES  Final diagnoses:  Ureteral stone with hydronephrosis  Kidney stones  Liver lesion     MEDICATIONS GIVEN DURING THIS VISIT:  Medications  oxyCODONE-acetaminophen (PERCOCET/ROXICET) 5-325 MG per tablet 1 tablet (1 tablet Oral Given 04/04/21 2107)  ondansetron (ZOFRAN-ODT) disintegrating tablet 4 mg (4 mg Oral Given 04/04/21 2107)  morphine 4 MG/ML injection 4 mg (4 mg Intravenous Given 04/05/21 0425)  ondansetron (ZOFRAN) injection 4 mg (4 mg Intravenous Given 04/05/21 0425)  iohexol (OMNIPAQUE) 350 MG/ML injection 100 mL (100 mLs Intravenous Contrast Given 04/05/21 0628)     ED Discharge Orders          Ordered    oxyCODONE-acetaminophen (PERCOCET) 5-325 MG tablet  Every 6 hours PRN        04/05/21 0725    ondansetron (ZOFRAN ODT) 4 MG disintegrating tablet        04/05/21 0725    tamsulosin (FLOMAX) 0.4 MG CAPS capsule        04/05/21 0725    docusate sodium (COLACE) 100 MG capsule        04/05/21 0725             Note:  This document was prepared using Dragon voice recognition software and may include unintentional dictation errors.   Loleta Rose, MD 04/05/21 [phone]

## 2021-04-05 NOTE — Discharge Instructions (Addendum)
You have been seen in the Emergency Department (ED) today for pain caused by kidney stones.  As we have discussed, please drink plenty of fluids.  Please make a follow up appointment with the physician(s) listed elsewhere in this documentation.  We also recommend you follow-up with your regular doctor to discuss the findings in your liver.  You have a couple of cystic areas that are similar areas that were seen 10 years ago, but they are little bit larger.  You may want to talk with your regular doctor about whether or not you would benefit from an outpatient MRI with IV contrast to better visualize the lesions.  However, they are not the reason for your issue tonight.  You may take pain medication as needed but ONLY as prescribed.  Please also take your prescribed Flomax daily.  If you are going to follow up with Urology for possible lithotripsy, please do not take any ibuprofen, naproxen, aspirin, Toradol, or other NSAID after Tuesday morning, as this may exclude you from lithotripsy.  Please see your doctor as soon as possible as stones may take 1-3 weeks to pass and you may require additional care or medications.  Do not drink alcohol, drive or participate in any other potentially dangerous activities while taking opiate pain medication as it may make you sleepy. Do not take this medication with any other sedating medications, either prescription or over-the-counter. If you were prescribed Percocet or Vicodin, do not take these with acetaminophen (Tylenol) as it is already contained within these medications.   Take Percocet as needed for severe pain.  This medication is an opiate (or narcotic) pain medication and can be habit forming.  Use it as little as possible to achieve adequate pain control.  Do not use or use it with extreme caution if you have a history of opiate abuse or dependence.  If you are on a pain contract with your primary care doctor or a pain specialist, be sure to let them know you  were prescribed this medication today from the [facility] Emergency Department.  This medication is intended for your use only - do not give any to anyone else and keep it in a secure place where nobody else, especially children, have access to it.  It will also cause or worsen constipation, so you may want to consider taking an over-the-counter stool softener while you are taking this medication.  Return to the Emergency Department (ED) or call your doctor if you have any worsening pain, fever, painful urination, are unable to urinate, or develop other symptoms that concern you.

## 2021-04-19 DIAGNOSIS — Z1211 Encounter for screening for malignant neoplasm of colon: Secondary | ICD-10-CM | POA: Diagnosis not present

## 2021-04-19 DIAGNOSIS — Z0000 Encounter for general adult medical examination without abnormal findings: Secondary | ICD-10-CM | POA: Diagnosis not present

## 2021-04-19 DIAGNOSIS — Z23 Encounter for immunization: Secondary | ICD-10-CM | POA: Diagnosis not present

## 2021-04-19 DIAGNOSIS — Z1159 Encounter for screening for other viral diseases: Secondary | ICD-10-CM | POA: Diagnosis not present

## 2021-04-19 DIAGNOSIS — E785 Hyperlipidemia, unspecified: Secondary | ICD-10-CM | POA: Diagnosis not present

## 2021-12-29 DIAGNOSIS — M79604 Pain in right leg: Secondary | ICD-10-CM | POA: Diagnosis not present

## 2022-01-17 DIAGNOSIS — K648 Other hemorrhoids: Secondary | ICD-10-CM | POA: Diagnosis not present

## 2022-01-17 DIAGNOSIS — K635 Polyp of colon: Secondary | ICD-10-CM | POA: Diagnosis not present

## 2022-01-17 DIAGNOSIS — Z09 Encounter for follow-up examination after completed treatment for conditions other than malignant neoplasm: Secondary | ICD-10-CM | POA: Diagnosis not present

## 2022-01-17 DIAGNOSIS — Z86010 Personal history of colonic polyps: Secondary | ICD-10-CM | POA: Diagnosis not present

## 2022-01-19 DIAGNOSIS — K635 Polyp of colon: Secondary | ICD-10-CM | POA: Diagnosis not present

## 2022-02-03 ENCOUNTER — Other Ambulatory Visit: Payer: Self-pay

## 2022-02-03 ENCOUNTER — Encounter (HOSPITAL_BASED_OUTPATIENT_CLINIC_OR_DEPARTMENT_OTHER): Payer: Self-pay | Admitting: *Deleted

## 2022-02-03 ENCOUNTER — Observation Stay (HOSPITAL_BASED_OUTPATIENT_CLINIC_OR_DEPARTMENT_OTHER)
Admission: EM | Admit: 2022-02-03 | Discharge: 2022-02-04 | Disposition: A | Discharge status: Home Health | Payer: Medicare Other | Attending: Student | Admitting: Student

## 2022-02-03 ENCOUNTER — Emergency Department (HOSPITAL_BASED_OUTPATIENT_CLINIC_OR_DEPARTMENT_OTHER): Payer: Medicare Other

## 2022-02-03 DIAGNOSIS — R42 Dizziness and giddiness: Secondary | ICD-10-CM

## 2022-02-03 DIAGNOSIS — R9089 Other abnormal findings on diagnostic imaging of central nervous system: Secondary | ICD-10-CM

## 2022-02-03 DIAGNOSIS — N400 Enlarged prostate without lower urinary tract symptoms: Secondary | ICD-10-CM | POA: Diagnosis present

## 2022-02-03 DIAGNOSIS — S0990XA Unspecified injury of head, initial encounter: Secondary | ICD-10-CM | POA: Diagnosis not present

## 2022-02-03 DIAGNOSIS — R9402 Abnormal brain scan: Secondary | ICD-10-CM | POA: Insufficient documentation

## 2022-02-03 DIAGNOSIS — Z7982 Long term (current) use of aspirin: Secondary | ICD-10-CM | POA: Insufficient documentation

## 2022-02-03 DIAGNOSIS — H6123 Impacted cerumen, bilateral: Secondary | ICD-10-CM | POA: Diagnosis not present

## 2022-02-03 DIAGNOSIS — W57XXXA Bitten or stung by nonvenomous insect and other nonvenomous arthropods, initial encounter: Secondary | ICD-10-CM | POA: Diagnosis not present

## 2022-02-03 DIAGNOSIS — R519 Headache, unspecified: Secondary | ICD-10-CM | POA: Diagnosis not present

## 2022-02-03 DIAGNOSIS — Z79899 Other long term (current) drug therapy: Secondary | ICD-10-CM | POA: Diagnosis not present

## 2022-02-03 DIAGNOSIS — Z96641 Presence of right artificial hip joint: Secondary | ICD-10-CM | POA: Insufficient documentation

## 2022-02-03 DIAGNOSIS — I639 Cerebral infarction, unspecified: Principal | ICD-10-CM | POA: Diagnosis present

## 2022-02-03 DIAGNOSIS — R29818 Other symptoms and signs involving the nervous system: Secondary | ICD-10-CM | POA: Diagnosis not present

## 2022-02-03 DIAGNOSIS — M48061 Spinal stenosis, lumbar region without neurogenic claudication: Secondary | ICD-10-CM | POA: Diagnosis present

## 2022-02-03 DIAGNOSIS — E785 Hyperlipidemia, unspecified: Secondary | ICD-10-CM | POA: Diagnosis present

## 2022-02-03 LAB — DIFFERENTIAL
Abs Immature Granulocytes: 0.02 10*3/uL (ref 0.00–0.07)
Basophils Absolute: 0.1 10*3/uL (ref 0.0–0.1)
Basophils Relative: 1 %
Eosinophils Absolute: 0.3 10*3/uL (ref 0.0–0.5)
Eosinophils Relative: 3 %
Immature Granulocytes: 0 %
Lymphocytes Relative: 20 %
Lymphs Abs: 1.9 10*3/uL (ref 0.7–4.0)
Monocytes Absolute: 0.7 10*3/uL (ref 0.1–1.0)
Monocytes Relative: 7 %
Neutro Abs: 6.8 10*3/uL (ref 1.7–7.7)
Neutrophils Relative %: 69 %

## 2022-02-03 LAB — CBC
HCT: 41.1 % (ref 39.0–52.0)
Hemoglobin: 13.8 g/dL (ref 13.0–17.0)
MCH: 31.1 pg (ref 26.0–34.0)
MCHC: 33.6 g/dL (ref 30.0–36.0)
MCV: 92.6 fL (ref 80.0–100.0)
Platelets: 196 10*3/uL (ref 150–400)
RBC: 4.44 MIL/uL (ref 4.22–5.81)
RDW: 12.8 % (ref 11.5–15.5)
WBC: 9.7 10*3/uL (ref 4.0–10.5)
nRBC: 0 % (ref 0.0–0.2)

## 2022-02-03 LAB — COMPREHENSIVE METABOLIC PANEL
ALT: 24 U/L (ref 0–44)
AST: 22 U/L (ref 15–41)
Albumin: 4.2 g/dL (ref 3.5–5.0)
Alkaline Phosphatase: 65 U/L (ref 38–126)
Anion gap: 9 (ref 5–15)
BUN: 12 mg/dL (ref 8–23)
CO2: 28 mmol/L (ref 22–32)
Calcium: 10 mg/dL (ref 8.9–10.3)
Chloride: 104 mmol/L (ref 98–111)
Creatinine, Ser: 0.74 mg/dL (ref 0.61–1.24)
GFR, Estimated: >60 mL/min (ref >60)
Glucose, Bld: 91 mg/dL (ref 70–99)
Potassium: 3.9 mmol/L (ref 3.5–5.1)
Sodium: 141 mmol/L (ref 135–145)
Total Bilirubin: 0.9 mg/dL (ref 0.3–1.2)
Total Protein: 7.2 g/dL (ref 6.5–8.1)

## 2022-02-03 LAB — APTT: aPTT: 33 seconds (ref 24–36)

## 2022-02-03 LAB — URINALYSIS, ROUTINE W REFLEX MICROSCOPIC
Bilirubin Urine: NEGATIVE
Glucose, UA: NEGATIVE mg/dL
Ketones, ur: 15 mg/dL — AB
Nitrite: NEGATIVE
Protein, ur: NEGATIVE mg/dL
Specific Gravity, Urine: 1.023 (ref 1.005–1.030)
pH: 5.5 (ref 5.0–8.0)

## 2022-02-03 LAB — CBG MONITORING, ED: Glucose-Capillary: 81 mg/dL (ref 70–99)

## 2022-02-03 LAB — PROTIME-INR
INR: 1.1 (ref 0.8–1.2)
Prothrombin Time: 14.2 seconds (ref 11.4–15.2)

## 2022-02-03 MED ORDER — SODIUM CHLORIDE 0.9% FLUSH
3.0000 mL | Freq: Once | INTRAVENOUS | Status: DC
  Filled 2022-02-03: qty 3

## 2022-02-03 NOTE — ED Triage Notes (Addendum)
Pt was sent here from PCP where he was seen for being light headed and dizzy and feels like his balance is off since monday.  Pt states that the symptoms are with movement, especially quick movement.  Pt had a tick bite 2 weeks ago.  No focal weakness noted, speech clear, pt is alert and oriented.  Pt also notes that 2 days ago while driving he noted that his depth perception was off and he rode up on the curb.

## 2022-02-03 NOTE — ED Notes (Signed)
Pt daughter called for a update [phone]

## 2022-02-03 NOTE — ED Notes (Addendum)
Nurse to nurse given to Charge RN at [facility] ED // accepting MD Dr. Rodena Medin.   Pt and family/friend given paperwork for transfer and information on where to go on arrival. Pt is stable at the time of discharge. Dr. Ardeen Jourdain have approved for pt to go POV to [facility] ED.  INT secured
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
## 2022-02-03 NOTE — ED Notes (Signed)
Patient transported to CT 

## 2022-02-04 ENCOUNTER — Encounter (HOSPITAL_COMMUNITY): Payer: Self-pay | Admitting: Internal Medicine

## 2022-02-04 ENCOUNTER — Other Ambulatory Visit (HOSPITAL_COMMUNITY): Payer: Medicare Other

## 2022-02-04 ENCOUNTER — Observation Stay (HOSPITAL_COMMUNITY): Payer: Medicare Other

## 2022-02-04 ENCOUNTER — Emergency Department (HOSPITAL_COMMUNITY): Payer: Medicare Other

## 2022-02-04 ENCOUNTER — Observation Stay (HOSPITAL_BASED_OUTPATIENT_CLINIC_OR_DEPARTMENT_OTHER): Payer: Medicare Other

## 2022-02-04 DIAGNOSIS — I639 Cerebral infarction, unspecified: Secondary | ICD-10-CM | POA: Diagnosis not present

## 2022-02-04 DIAGNOSIS — E785 Hyperlipidemia, unspecified: Secondary | ICD-10-CM | POA: Diagnosis not present

## 2022-02-04 DIAGNOSIS — I6389 Other cerebral infarction: Secondary | ICD-10-CM

## 2022-02-04 DIAGNOSIS — I6523 Occlusion and stenosis of bilateral carotid arteries: Secondary | ICD-10-CM | POA: Diagnosis not present

## 2022-02-04 DIAGNOSIS — I6501 Occlusion and stenosis of right vertebral artery: Secondary | ICD-10-CM | POA: Diagnosis not present

## 2022-02-04 DIAGNOSIS — M48061 Spinal stenosis, lumbar region without neurogenic claudication: Secondary | ICD-10-CM

## 2022-02-04 DIAGNOSIS — N400 Enlarged prostate without lower urinary tract symptoms: Secondary | ICD-10-CM

## 2022-02-04 DIAGNOSIS — R29818 Other symptoms and signs involving the nervous system: Secondary | ICD-10-CM | POA: Diagnosis not present

## 2022-02-04 LAB — LIPID PANEL
Cholesterol: 120 mg/dL (ref 0–200)
HDL: 47 mg/dL (ref >40)
LDL Cholesterol: 64 mg/dL (ref 0–99)
Total CHOL/HDL Ratio: 2.6 RATIO
Triglycerides: 44 mg/dL (ref <150)
VLDL: 9 mg/dL (ref 0–40)

## 2022-02-04 LAB — ECHOCARDIOGRAM COMPLETE
AR max vel: 2.24 cm2
AV Peak grad: 9.5 mmHg
Ao pk vel: 1.55 m/s
Area-P 1/2: 3.34 cm2
Height: 69 in
S' Lateral: 4.4 cm
Single Plane A4C EF: 38 %
Weight: 3039.98 oz

## 2022-02-04 LAB — CREATININE, SERUM
Creatinine, Ser: 0.71 mg/dL (ref 0.61–1.24)
GFR, Estimated: >60 mL/min (ref >60)

## 2022-02-04 LAB — CBC
HCT: 38.9 % — ABNORMAL LOW (ref 39.0–52.0)
Hemoglobin: 13.1 g/dL (ref 13.0–17.0)
MCH: 31.6 pg (ref 26.0–34.0)
MCHC: 33.7 g/dL (ref 30.0–36.0)
MCV: 93.7 fL (ref 80.0–100.0)
Platelets: 173 10*3/uL (ref 150–400)
RBC: 4.15 MIL/uL — ABNORMAL LOW (ref 4.22–5.81)
RDW: 12.8 % (ref 11.5–15.5)
WBC: 9.5 10*3/uL (ref 4.0–10.5)
nRBC: 0 % (ref 0.0–0.2)

## 2022-02-04 LAB — HEMOGLOBIN A1C
Hgb A1c MFr Bld: 5.1 % (ref 4.8–5.6)
Mean Plasma Glucose: 99.67 mg/dL

## 2022-02-04 MED ORDER — STROKE: EARLY STAGES OF RECOVERY BOOK: Freq: Once | Status: DC

## 2022-02-04 MED ORDER — ASPIRIN 81 MG PO TBEC
81.0000 mg | DELAYED_RELEASE_TABLET | Freq: Every day | ORAL | Status: DC
  Administered 2022-02-04: 81 mg via ORAL
  Filled 2022-02-04: qty 1

## 2022-02-04 MED ORDER — ATORVASTATIN CALCIUM 40 MG PO TABS
40.0000 mg | ORAL_TABLET | Freq: Every day | ORAL | Status: DC
  Administered 2022-02-04: 40 mg via ORAL
  Filled 2022-02-04: qty 1

## 2022-02-04 MED ORDER — SODIUM CHLORIDE 0.9 % IV SOLN: INTRAVENOUS | Status: DC

## 2022-02-04 MED ORDER — ACETAMINOPHEN 325 MG PO TABS: 650.0000 mg | ORAL_TABLET | ORAL | Status: DC | PRN

## 2022-02-04 MED ORDER — STROKE: EARLY STAGES OF RECOVERY BOOK
Freq: Once | Status: AC
  Filled 2022-02-04: qty 1

## 2022-02-04 MED ORDER — DULOXETINE HCL 60 MG PO CPEP
60.0000 mg | ORAL_CAPSULE | Freq: Every day | ORAL | Status: DC
  Administered 2022-02-04: 60 mg via ORAL
  Filled 2022-02-04: qty 1

## 2022-02-04 MED ORDER — ASPIRIN EC 81 MG PO TBEC: 81.0000 mg | DELAYED_RELEASE_TABLET | Freq: Every day | ORAL | 0 refills | Status: DC

## 2022-02-04 MED ORDER — ENOXAPARIN SODIUM 40 MG/0.4ML IJ SOSY
40.0000 mg | PREFILLED_SYRINGE | INTRAMUSCULAR | Status: DC
  Administered 2022-02-04: 40 mg via SUBCUTANEOUS
  Filled 2022-02-04: qty 0.4

## 2022-02-04 MED ORDER — CLOPIDOGREL BISULFATE 75 MG PO TABS: 75.0000 mg | ORAL_TABLET | Freq: Every day | ORAL | 3 refills | Status: DC

## 2022-02-04 MED ORDER — BUPROPION HCL ER (XL) 150 MG PO TB24
150.0000 mg | ORAL_TABLET | Freq: Every day | ORAL | Status: DC
  Administered 2022-02-04: 150 mg via ORAL
  Filled 2022-02-04: qty 1

## 2022-02-04 MED ORDER — ACETAMINOPHEN 160 MG/5ML PO SOLN: 650.0000 mg | ORAL | Status: DC | PRN

## 2022-02-04 MED ORDER — CLOPIDOGREL BISULFATE 75 MG PO TABS
75.0000 mg | ORAL_TABLET | Freq: Every day | ORAL | Status: DC
  Administered 2022-02-04: 75 mg via ORAL
  Filled 2022-02-04: qty 1

## 2022-02-04 MED ORDER — ACETAMINOPHEN 650 MG RE SUPP: 650.0000 mg | RECTAL | Status: DC | PRN

## 2022-02-04 MED ORDER — IOHEXOL 350 MG/ML SOLN
100.0000 mL | Freq: Once | INTRAVENOUS | Status: AC | PRN
Start: 2022-02-04 — End: 2022-02-04
  Administered 2022-02-04: 100 mL via INTRAVENOUS

## 2022-02-04 NOTE — ED Notes (Signed)
Admit provider advises to get in touch with [facility] pt home PT and OT once this is completed pt can be d/c from the ER to go home

## 2022-02-04 NOTE — ED Notes (Signed)
Pt family is requesting to speak with a provider before pt is d/c home

## 2022-02-04 NOTE — ED Notes (Signed)
Patient currently in MRI.

## 2022-02-04 NOTE — Discharge Summary (Signed)
Physician Discharge Summary  [name] [id number] DOB: 1945-12-08 DOA: 02/03/2022  PCP: Carol Ada, MD  Admit date: 02/03/2022 Discharge date: 02/04/2022 Admitted From: Home Disposition: Home Recommendations for Outpatient Follow-up:  Follow ups as below.   Consider referral to spine surgery for lumbar stenosis Please obtain CBC/BMP at follow up Please follow up on the following pending results: None  Home Health: PT/OT Equipment/Devices: Patient has appropriate DME  Discharge Condition: Stable CODE STATUS: Full code  Follow-up Information     Carol Ada, MD. Schedule an appointment as soon as possible for a visit in 1 week(s).   Specialty: Family Medicine Contact information: [address] [place] [address] [phone]         [name]. Schedule an appointment as soon as possible for a visit in 5 week(s).   Specialty: Neurology Contact information: [address] [facility] course 76 year old M with PMH of HLD, BPH and lumbar spinal stenosis presenting with headache and dizziness for about 5 days that has started after working in the yard.  He denies fever or syncope.  Reportedly had multiple tick bites while working in the yard.  He had no skin rash or joint pains.  He got concerned about tick bite disease and presented to ED.   In ED, vitals stable.  CMP, CBC with differential, UA, coag labs and EKG without significant finding.  CT head without contrast without evidence of acute cortically based infarct or intracranial hemorrhage but extensive nonspecific hypodensity in the cerebral white matter and basal ganglia.  MRI brain showed punctate focus of subacute ischemia within the superior right frontal lobe and multiple chronic microhemorrhages in the predominantly central distribution consistent with chronic hypertensive angiopathy.  CTA  head and neck without flow obstructing stenosis.  TTE with LVEF of 50%, G1 DD and RVSP of 31.3 mmHg.  LDL 64.  A1c 5.1%.  Patient was evaluated by neurology.  His symptoms improved.  He was cleared for discharge by neurology on low-dose aspirin and Plavix for 3 weeks followed by Plavix alone given CVA while on low-dose aspirin.  Home health PT and OT ordered as recommended by therapy.  He already has appropriate DME's at home.  Findings and recommendation discussed with patient's daughter over the phone.  See individual problem list below for more on hospital course.  Problems addressed during this hospitalization Principal Problem:   Acute cerebrovascular accident (CVA) due to ischemia [facility]) Active Problems:   Spinal stenosis of lumbar region   Hyperlipidemia   BPH (benign prostatic hyperplasia)   Vital signs Vitals:   02/04/22 1225 02/04/22 1433 02/04/22 1500 02/04/22 1600  BP: 128/73 119/83 130/83 123/76  Pulse: 70 65 67 73  Temp:      Resp: 13 18 14 19   Height:      Weight:      SpO2: 95% 94% 93% 98%  TempSrc:      BMI (Calculated):         Discharge exam  GENERAL: No apparent distress.  Nontoxic. HEENT: MMM.  Vision and hearing grossly intact.  NECK: Supple.  No apparent JVD.  RESP:  No IWOB.  Fair aeration bilaterally. CVS:  RRR. Heart sounds normal.  ABD/GI/GU: BS+. Abd soft, NTND.  MSK/EXT:  Moves extremities. No apparent deformity. No edema.  SKIN: no apparent skin lesion or wound NEURO: Awake,  alert and oriented appropriately. Speech clear. Cranial nerves II-XII intact. Motor 5/5 in all muscle groups of UE and LE bilaterally, Normal tone. Light sensation intact in all dermatomes of upper and lower ext bilaterally. Patellar reflex symmetric.  No pronator drift.  Finger to nose intact. PSYCH: Calm. Normal affect.   Discharge Instructions Discharge Instructions     Call MD for:   Complete by: As directed    Focal weakness, numbness, tingling, acute vision  change or other symptoms concerning to you   Call MD for:  extreme fatigue   Complete by: As directed    Call MD for:  persistant dizziness or light-headedness   Complete by: As directed    Diet - low sodium heart healthy   Complete by: As directed    Discharge instructions   Complete by: As directed    It has been a pleasure taking care of you!  You were hospitalized due to headache, dizziness and unsteady gait.  This could be related to stroke as noted on MRI of your brain.  We have started you on medication to reduce your risk of stroke.  Please review your new medication list and the directions on your medications before you take them.  Follow-up with your primary care doctor in 1 to 2 weeks or sooner if needed.  Follow-up with neurology in about 4 to 6 weeks.  Avoid any over-the-counter pain medication other than plain Tylenol while taking Plavix.   Take care,   Increase activity slowly   Complete by: As directed       Allergies as of 02/04/2022   No Known Allergies      Medication List     STOP taking these medications    HYDROcodone-acetaminophen 5-325 MG tablet Commonly known as: Norco   oxyCODONE-acetaminophen 5-325 MG tablet Commonly known as: Percocet       TAKE these medications    acetaminophen 500 MG tablet Commonly known as: TYLENOL Take 1,000 mg by mouth every 6 (six) hours as needed for moderate pain or headache.   aspirin EC 81 MG tablet Take 1 tablet (81 mg total) by mouth daily.   atorvastatin 40 MG tablet Commonly known as: LIPITOR Take 40 mg by mouth daily.   buPROPion 150 MG 24 hr tablet Commonly known as: WELLBUTRIN XL Take 150 mg by mouth daily.   clopidogrel 75 MG tablet Commonly known as: PLAVIX Take 1 tablet (75 mg total) by mouth daily. Start taking on: February 05, 2022   Cyanocobalamin 1500 MCG Tbdp Take 1,000 mcg by mouth daily. VITAMIN B-12   docusate sodium 100 MG capsule Commonly known as: Colace Take 1 tablet once or  twice daily as needed for constipation while taking narcotic pain medicine What changed:  how much to take how to take this when to take this reasons to take this additional instructions   DULoxetine 60 MG capsule Commonly known as: CYMBALTA Take 60 mg by mouth daily.   Fish Oil 1200 MG Caps Take 1,200 mg by mouth daily.   latanoprost 0.005 % ophthalmic solution Commonly known as: XALATAN Place 1 drop into both eyes at bedtime.   multivitamin with minerals Tabs tablet Take 1 tablet by mouth daily.   ondansetron 4 MG disintegrating tablet Commonly known as: Zofran ODT Allow 1-2 tablets to dissolve in your mouth every 8 hours as needed for nausea/vomiting What changed:  how much to take how to take this when to take this reasons to take this additional instructions  tamsulosin 0.4 MG Caps capsule Commonly known as: FLOMAX Take 1 tablet by mouth daily until you pass the kidney stone or no longer have symptoms        Consultations: Neurology  Procedures/Studies:   ECHOCARDIOGRAM COMPLETE  Result Date: 02/04/2022    ECHOCARDIOGRAM REPORT   Patient Name:   [name] Date of Exam: 02/04/2022 Medical Rec #:  [id number]        Height:       69.0 in Accession #:    [id number]       Weight:       190.0 lb Date of Birth:  July 07, 1946        BSA:          2.021 m Patient Age:    76 years         BP:           119/83 mmHg Patient Gender: M                HR:           65 bpm. Exam Location:  Inpatient Procedure: 2D Echo, Cardiac Doppler and Color Doppler Indications:    Stroke  History:        Patient has no prior history of Echocardiogram examinations.  Sonographer:    Jefferey Pica Referring Phys: [place]  1. Left ventricular ejection fraction, by estimation, is 50%. The left ventricle has low normal function. The left ventricle has no regional wall motion abnormalities. There is mild left ventricular hypertrophy. Left ventricular diastolic  parameters are  consistent with Grade I diastolic dysfunction (impaired relaxation).  2. Right ventricular systolic function is normal. The right ventricular size is normal. There is normal pulmonary artery systolic pressure. The estimated right ventricular systolic pressure is 123456 mmHg.  3. The mitral valve is normal in structure. Trivial mitral valve regurgitation. No evidence of mitral stenosis.  4. The aortic valve is grossly normal. Aortic valve regurgitation is not visualized. No aortic stenosis is present.  5. The inferior vena cava is normal in size with greater than 50% respiratory variability, suggesting right atrial pressure of 3 mmHg. Conclusion(s)/Recommendation(s): No intracardiac source of embolism detected on this transthoracic study. Consider a transesophageal echocardiogram to exclude cardiac source of embolism if clinically indicated. FINDINGS  Left Ventricle: Left ventricular ejection fraction, by estimation, is 50%. The left ventricle has low normal function. The left ventricle has no regional wall motion abnormalities. The left ventricular internal cavity size was normal in size. There is mild left ventricular hypertrophy. Left ventricular diastolic parameters are consistent with Grade I diastolic dysfunction (impaired relaxation). Right Ventricle: The right ventricular size is normal. No increase in right ventricular wall thickness. Right ventricular systolic function is normal. There is normal pulmonary artery systolic pressure. The tricuspid regurgitant velocity is 2.66 m/s, and  with an assumed right atrial pressure of 3 mmHg, the estimated right ventricular systolic pressure is 123456 mmHg. Left Atrium: Left atrial size was normal in size. Right Atrium: Right atrial size was normal in size. Pericardium: There is no evidence of pericardial effusion. Mitral Valve: The mitral valve is normal in structure. Trivial mitral valve regurgitation. No evidence of mitral valve stenosis. Tricuspid  Valve: The tricuspid valve is normal in structure. Tricuspid valve regurgitation is trivial. No evidence of tricuspid stenosis. Aortic Valve: The aortic valve is grossly normal. Aortic valve regurgitation is not visualized. No aortic stenosis is present. Aortic valve peak gradient measures 9.5 mmHg.  Pulmonic Valve: The pulmonic valve was normal in structure. Pulmonic valve regurgitation is trivial. No evidence of pulmonic stenosis. Aorta: The aortic root is normal in size and structure. Venous: The inferior vena cava is normal in size with greater than 50% respiratory variability, suggesting right atrial pressure of 3 mmHg. IAS/Shunts: No atrial level shunt detected by color flow Doppler.  LEFT VENTRICLE PLAX 2D LVIDd:         6.10 cm      Diastology LVIDs:         4.40 cm      LV e' medial:    6.32 cm/s LV PW:         1.10 cm      LV E/e' medial:  8.6 LV IVS:        1.00 cm      LV e' lateral:   8.59 cm/s LVOT diam:     2.00 cm      LV E/e' lateral: 6.3 LV SV:         67 LV SV Index:   33 LVOT Area:     3.14 cm  LV Volumes (MOD) LV vol d, MOD A4C: 141.0 ml LV vol s, MOD A4C: 87.4 ml LV SV MOD A4C:     141.0 ml RIGHT VENTRICLE             IVC RV Basal diam:  3.30 cm     IVC diam: 2.20 cm RV S prime:     19.10 cm/s TAPSE (M-mode): 2.4 cm LEFT ATRIUM             Index        RIGHT ATRIUM           Index LA diam:        4.50 cm 2.23 cm/m   RA Area:     19.50 cm LA Vol (A2C):   62.9 ml 31.12 ml/m  RA Volume:   55.00 ml  27.21 ml/m LA Vol (A4C):   59.9 ml 29.63 ml/m LA Biplane Vol: 63.3 ml 31.31 ml/m  AORTIC VALVE                 PULMONIC VALVE AV Area (Vmax): 2.24 cm     PV Vmax:       0.84 m/s AV Vmax:        154.50 cm/s  PV Peak grad:  2.8 mmHg AV Peak Grad:   9.5 mmHg LVOT Vmax:      110.00 cm/s LVOT Vmean:     70.200 cm/s LVOT VTI:       0.213 m  AORTA Ao Root diam: 2.80 cm Ao Asc diam:  3.30 cm MITRAL VALVE               TRICUSPID VALVE MV Area (PHT): 3.34 cm    TR Peak grad:   28.3 mmHg MV Decel Time: 227  msec    TR Vmax:        266.00 cm/s MV E velocity: 54.30 cm/s MV A velocity: 79.50 cm/s  SHUNTS MV E/A ratio:  0.68        Systemic VTI:  0.21 m                            Systemic Diam: 2.00 cm Cherlynn Kaiser MD Electronically signed by Cherlynn Kaiser MD Signature Date/Time: 02/04/2022/3:00:48 PM    Final    CT ANGIO HEAD NECK W  WO CM  Result Date: 02/04/2022 CLINICAL DATA:  Stroke, determine embolic source EXAM: CT ANGIOGRAPHY HEAD AND NECK TECHNIQUE: Multidetector CT imaging of the head and neck was performed using the standard protocol during bolus administration of intravenous contrast. Multiplanar CT image reconstructions and MIPs were obtained to evaluate the vascular anatomy. Carotid stenosis measurements (when applicable) are obtained utilizing NASCET criteria, using the distal internal carotid diameter as the denominator. RADIATION DOSE REDUCTION: This exam was performed according to the departmental dose-optimization program which includes automated exposure control, adjustment of the mA and/or kV according to patient size and/or use of iterative reconstruction technique. CONTRAST:  OMNIPAQUE IOHEXOL 350 MG/ML SOLN COMPARISON:  Brain MRI from earlier today FINDINGS: CT HEAD FINDINGS Brain: The patient's acute white matter infarct is occult by CT. Extensive chronic small vessel ischemic change in the hemispheric white matter, deep gray nuclei, and brainstem. No hemorrhage or hydrocephalus Vascular: See below Skull: Negative Sinuses: Negative Orbits: Bilateral cataract resection Review of the MIP images confirms the above findings CTA NECK FINDINGS Aortic arch: Atheromatous plaque. Three vessel branching. No acute finding Right carotid system: Atheromatous wall thickening of the common carotid with mixed density plaque at the bifurcation. No flow limiting stenosis or ulceration Left carotid system: Mild for age atheromatous plaque without stenosis or ulceration. Vertebral arteries: No proximal  subclavian stenosis. Codominant vertebral arteries. Listen 50% right V2 segment narrowing. No dissection or generalized beading. Skeleton: Advanced cervical spine degeneration which is generalized. Other neck: No acute or aggressive finding. Upper chest: Clear apical lungs Review of the MIP images confirms the above findings CTA HEAD FINDINGS Anterior circulation: Atheromatous calcification of the carotid siphons without significant stenosis. No branch occlusion, beading, or aneurysm Posterior circulation: The vertebral and basilar arteries are smoothly contoured and diffusely patent. Fetal type left PCA. No branch occlusion, beading, or aneurysm Venous sinuses: Unremarkable Anatomic variants: As above Review of the MIP images confirms the above findings IMPRESSION: 1. No emergent finding. Mild for age atherosclerosis without flow limiting stenosis or ulceration of major vessels. 2. Advanced chronic small vessel ischemia. Electronically Signed   By: Tiburcio Pea M.D.   On: 02/04/2022 06:54   MR BRAIN WO CONTRAST  Result Date: 02/04/2022 CLINICAL DATA:  Acute neurologic deficit EXAM: MRI HEAD WITHOUT CONTRAST TECHNIQUE: Multiplanar, multiecho pulse sequences of the brain and surrounding structures were obtained without intravenous contrast. COMPARISON:  None Available. FINDINGS: Brain: Punctate focus of abnormal DWI signal intensity within the superior right frontal lobe. No clear ADC correlate. No other diffusion abnormality. Multiple chronic microhemorrhages in a predominantly central distribution. There is multifocal hyperintense T2-weighted signal within the white matter. Generalized cerebral volume loss. The midline structures are normal. Vascular: Major flow voids are preserved. Skull and upper cervical spine: Normal calvarium and skull base. Visualized upper cervical spine and soft tissues are normal. Sinuses/Orbits:No paranasal sinus fluid levels or advanced mucosal thickening. No mastoid or middle ear  effusion. Normal orbits. IMPRESSION: 1. Punctate focus of subacute ischemia within the superior right frontal lobe. No hemorrhage or mass effect. 2. Multiple chronic microhemorrhages in a predominantly central distribution, consistent with chronic hypertensive angiopathy. Electronically Signed   By: Deatra Robinson M.D.   On: 02/04/2022 02:38   CT HEAD WO CONTRAST  Result Date: 02/03/2022 CLINICAL DATA:  Dizziness. EXAM: CT HEAD WITHOUT CONTRAST TECHNIQUE: Contiguous axial images were obtained from the base of the skull through the vertex without intravenous contrast. RADIATION DOSE REDUCTION: This exam was performed according to the departmental dose-optimization  program which includes automated exposure control, adjustment of the mA and/or kV according to patient size and/or use of iterative reconstruction technique. COMPARISON:  None Available. FINDINGS: Brain: There is no evidence of an acute cortically based infarct, intracranial hemorrhage, mass, midline shift, or extra-axial fluid collection. Patchy to confluent hypodensities are present in the cerebral white matter bilaterally, and there is also symmetric, heterogeneous hypodensity throughout the basal ganglia bilaterally a more discrete 3 mm hypodensity in the left caudate head is compatible with a chronic lacunar infarct. The ventricles and sulci are within normal limits for age. Vascular: Calcified atherosclerosis at the skull base. No hyperdense vessel. Skull: No fracture or suspicious osseous lesion. Sinuses/Orbits: Trace fluid in the left sphenoid sinus. Clear mastoid air cells. Bilateral cataract extraction. Other: None. IMPRESSION: 1. No evidence of an acute cortically based infarct or intracranial hemorrhage. 2. Extensive hypodensity in the cerebral white matter and basal ganglia, nonspecific. While this may all reflect chronic small vessel ischemia, superimposed acute ischemia or a toxic/metabolic insult is not excluded. Consider MRI for further  evaluation. Electronically Signed   By: Logan Bores M.D.   On: 02/03/2022 19:33       The results of significant diagnostics from this hospitalization (including imaging, microbiology, ancillary and laboratory) are listed below for reference.     Microbiology: No results found for this or any previous visit (from the past 240 hour(s)).   Labs:  CBC: Recent Labs  Lab 02/03/22 1816 02/04/22 0628  WBC 9.7 9.5  NEUTROABS 6.8  --   HGB 13.8 13.1  HCT 41.1 38.9*  MCV 92.6 93.7  PLT 196 173   BMP &GFR Recent Labs  Lab 02/03/22 1816 02/04/22 0628  NA 141  --   K 3.9  --   CL 104  --   CO2 28  --   GLUCOSE 91  --   BUN 12  --   CREATININE 0.74 0.71  CALCIUM 10.0  --    Estimated Creatinine Clearance: 85.4 mL/min (by C-G formula based on SCr of 0.71 mg/dL). Liver & Pancreas: Recent Labs  Lab 02/03/22 1816  AST 22  ALT 24  ALKPHOS 65  BILITOT 0.9  PROT 7.2  ALBUMIN 4.2   No results for input(s): "LIPASE", "AMYLASE" in the last 168 hours. No results for input(s): "AMMONIA" in the last 168 hours. Diabetic: Recent Labs    02/04/22 0628  HGBA1C 5.1   Recent Labs  Lab 02/03/22 1906  GLUCAP 81   Cardiac Enzymes: No results for input(s): "CKTOTAL", "CKMB", "CKMBINDEX", "TROPONINI" in the last 168 hours. No results for input(s): "PROBNP" in the last 8760 hours. Coagulation Profile: Recent Labs  Lab 02/03/22 1816  INR 1.1   Thyroid Function Tests: No results for input(s): "TSH", "T4TOTAL", "FREET4", "T3FREE", "THYROIDAB" in the last 72 hours. Lipid Profile: Recent Labs    02/04/22 0628  CHOL 120  HDL 47  LDLCALC 64  TRIG 44  CHOLHDL 2.6   Anemia Panel: No results for input(s): "VITAMINB12", "FOLATE", "FERRITIN", "TIBC", "IRON", "RETICCTPCT" in the last 72 hours. Urine analysis:    Component Value Date/Time   COLORURINE YELLOW 02/03/2022 2049   APPEARANCEUR CLEAR 02/03/2022 2049   LABSPEC 1.023 02/03/2022 2049   PHURINE 5.5 02/03/2022 2049    GLUCOSEU NEGATIVE 02/03/2022 2049   HGBUR TRACE (A) 02/03/2022 2049   BILIRUBINUR NEGATIVE 02/03/2022 2049   KETONESUR 15 (A) 02/03/2022 2049   PROTEINUR NEGATIVE 02/03/2022 2049   UROBILINOGEN 1.0 06/13/2011 1220   NITRITE  NEGATIVE 02/03/2022 2049   LEUKOCYTESUR TRACE (A) 02/03/2022 2049   Sepsis Labs: Invalid input(s): "PROCALCITONIN", "LACTICIDVEN"   Time coordinating discharge: 45 minutes  SIGNED:  Mercy Riding, MD  [facility] 02/04/2022, 9:44 PM

## 2022-02-04 NOTE — ED Notes (Signed)
Pt moved to room at this time 

## 2022-02-04 NOTE — ED Notes (Signed)
Transport request placed to take pt to floor

## 2022-02-04 NOTE — Evaluation (Signed)
Occupational Therapy Evaluation Patient Details Name: [name] MRN: [id number] DOB: August 27, 1946 Today's Date: 02/04/2022   History of Present Illness The pt is a 76 yo male presenting 6/9 with dizziness and incoordination since 6/5. MRI showed R frontal subacute infarct. PMH includes: HLD, BPH, anxiety, pelvic crush injury, and arthritis.   Clinical Impression   Pt admitted for concerns listed above. PTA pt reported that he was independent with all ADL's and IADL's, using a cane prn. At this time, pt presents with weakness/balance deficits, decreased safety awareness, and visual concerns. He is requiring min guard for all ADL's and mobility due to safety. Recommending HHOT to maximize independence and safety at home, as well as family checking in more frequently. Pt has no further acute OT needs and OT will signoff.       Recommendations for follow up therapy are one component of a multi-disciplinary discharge planning process, led by the attending physician.  Recommendations may be updated based on patient status, additional functional criteria and insurance authorization.   Follow Up Recommendations  Home health OT    Assistance Recommended at Discharge Intermittent Supervision/Assistance  Patient can return home with the following A little help with walking and/or transfers;A little help with bathing/dressing/bathroom;Assistance with cooking/housework;Direct supervision/assist for medications management;Direct supervision/assist for financial management;Assist for transportation;Help with stairs or ramp for entrance    Functional Status Assessment  Patient has had a recent decline in their functional status and demonstrates the ability to make significant improvements in function in a reasonable and predictable amount of time.  Equipment Recommendations  None recommended by OT    Recommendations for Other Services       Precautions / Restrictions Precautions Precautions:  Fall Precaution Comments: pt reports frequent falls Restrictions Weight Bearing Restrictions: No      Mobility Bed Mobility Overal bed mobility: Modified Independent             General bed mobility comments: increased time and use of rails    Transfers Overall transfer level: Needs assistance Equipment used: None Transfers: Sit to/from Stand Sit to Stand: Min guard           General transfer comment: ming for safety      Balance Overall balance assessment: Needs assistance, History of Falls Sitting-balance support: No upper extremity supported Sitting balance-Leahy Scale: Good     Standing balance support: No upper extremity supported, During functional activity Standing balance-Leahy Scale: Fair Standing balance comment: unable to tolerate challenge, but ambulating without UE support                           ADL either performed or assessed with clinical judgement   ADL Overall ADL's : Needs assistance/impaired                                       General ADL Comments: Min Guard for all ADL's due to weakness/balance deficits     Vision Baseline Vision/History: 1 Wears glasses Ability to See in Adequate Light: 0 Adequate Patient Visual Report: Other (comment) Vision Assessment?: Vision impaired- to be further tested in functional context Additional Comments: Running into objects on L side, Had difficulty seeing in R peripheral on assessment EOB.     Perception     Praxis      Pertinent Vitals/Pain Pain Assessment Pain Assessment: No/denies pain  Hand Dominance Right   Extremity/Trunk Assessment Upper Extremity Assessment Upper Extremity Assessment: Overall WFL for tasks assessed   Lower Extremity Assessment Lower Extremity Assessment: Defer to PT evaluation   Cervical / Trunk Assessment Cervical / Trunk Assessment:  (R lateral lean)   Communication Communication Communication: No difficulties    Cognition Arousal/Alertness: Awake/alert Behavior During Therapy: WFL for tasks assessed/performed Overall Cognitive Status: Impaired/Different from baseline Area of Impairment: Attention, Safety/judgement, Awareness, Problem solving                   Current Attention Level: Sustained     Safety/Judgement: Decreased awareness of safety, Decreased awareness of deficits Awareness: Intellectual Problem Solving: Decreased initiation, Requires verbal cues General Comments: pt with poor environmental awareness or ability to adjust to environment without cues. tripping when runnin into objects while walking     General Comments  VSS on RA    Exercises     Shoulder Instructions      Home Living Family/patient expects to be discharged to:: Private residence Living Arrangements: Alone Available Help at Discharge: Family;Friend(s);Available PRN/intermittently Type of Home: House Home Access: Ramped entrance     Home Layout: Multi-level;Able to live on main level with bedroom/bathroom Alternate Level Stairs-Number of Steps: 4 stairs Alternate Level Stairs-Rails: Right Bathroom Shower/Tub: Chief Strategy Officer: Standard     Home Equipment: Cane - single point;Rolling Walker (2 wheels);Grab bars - tub/shower;Shower seat;Grab bars - toilet          Prior Functioning/Environment Prior Level of Function : Independent/Modified Independent;Driving             Mobility Comments: Uses a cane sometimes          OT Problem List: Impaired vision/perception;Impaired balance (sitting and/or standing);Decreased cognition;Decreased safety awareness      OT Treatment/Interventions:      OT Goals(Current goals can be found in the care plan section) Acute Rehab OT Goals Patient Stated Goal: To get stronger OT Goal Formulation: With patient Time For Goal Achievement: 02/04/22 Potential to Achieve Goals: Good  OT Frequency:      Co-evaluation PT/OT/SLP  Co-Evaluation/Treatment: Yes Reason for Co-Treatment: Necessary to address cognition/behavior during functional activity;To address functional/ADL transfers;For patient/therapist safety PT goals addressed during session: Mobility/safety with mobility;Balance OT goals addressed during session: ADL's and self-care      AM-PAC OT "6 Clicks" Daily Activity     Outcome Measure Help from another person eating meals?: A Little Help from another person taking care of personal grooming?: A Little Help from another person toileting, which includes using toliet, bedpan, or urinal?: A Little Help from another person bathing (including washing, rinsing, drying)?: A Little Help from another person to put on and taking off regular upper body clothing?: A Little Help from another person to put on and taking off regular lower body clothing?: A Little 6 Click Score: 18   End of Session Equipment Utilized During Treatment: Gait belt Nurse Communication: Mobility status  Activity Tolerance: Patient tolerated treatment well Patient left: in bed;with call bell/phone within reach  OT Visit Diagnosis: Unsteadiness on feet (R26.81);Other abnormalities of gait and mobility (R26.89);Muscle weakness (generalized) (M62.81)                Time: 5631-4970 OT Time Calculation (min): 35 min Charges:  OT General Charges $OT Visit: 1 Visit OT Evaluation $OT Eval Moderate Complexity: 1 Mod  Pebbles Zeiders H., OTR/L Acute Rehabilitation  Jusitn Salsgiver Elane Genean Adamski 02/04/2022, 5:10 PM

## 2022-02-04 NOTE — H&P (Signed)
History and Physical    [name] [id number] DOB: Jul 29, 1946 DOA: 02/03/2022  PCP: Carol Ada, MD  Patient coming from: Home.  Chief Complaint: Headache and dizziness.  HPI: [name] is a 76 y.o. male with history of hyperlipidemia, BPH has been experiencing headache and dizziness for the last 5 days.  His symptoms started about 5 days ago when he was working in the yard.  Denies any fever chills loss of consciousness or any weakness of the extremities.  Given these persistent symptoms patient presents to the ER.  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  No rash on the body or any arthritis symptoms.  ED Course: In the ER MRI of the brain shows acute CVA neurology on-call was consulted patient admitted for further work-up.  EKG shows normal sinus rhythm.  Review of Systems: As per HPI, rest all negative.   Past Medical History:  Diagnosis Date   Arthritis    mild lower back   BPH (benign prostatic hyperplasia)    BPH (benign prostatic hyperplasia)    Depression    Dyspnea    with exertion   HLD (hyperlipidemia)    Lumbar spinal stenosis    Pelvic injury 2015   both sides    Past Surgical History:  Procedure Laterality Date   CARPAL TUNNEL RELEASE Right 02/19/2020   Procedure: CARPAL TUNNEL RELEASE;  Surgeon: Leanora Cover, MD;  Location: [place];  Service: Orthopedics;  Laterality: Right;   colonscopy     every 5 yrs   JOINT REPLACEMENT  12-23-12   right hip replaced   left carpal tunnel surgery  yrs ago   LUMBAR LAMINECTOMY/DECOMPRESSION MICRODISCECTOMY N/A 12/12/2017   Procedure: Decompressive lumbar laminectomy L4-L5 for stenosis;  Surgeon: Latanya Maudlin, MD;  Location: [facility];  Service: Orthopedics;  Laterality: N/A;   SHOULDER ARTHROSCOPY WITH ROTATOR CUFF REPAIR AND SUBACROMIAL DECOMPRESSION  12-23-12   Right/ left shoulder(microscopic surgery)   SHOULDER OPEN ROTATOR CUFF REPAIR Left 01/01/2013   Procedure: OPEN  ANTERIOR ACROMINECTOMY/ROTATOR CUFF REPAIR/DISTAL CLAVICLE RESECTION  LEFT SHOULDER;  Surgeon: Magnus Sinning, MD;  Location: [facility];  Service: Orthopedics;  Laterality: Left;   TONSILLECTOMY     child     reports that he has never smoked. He has never used smokeless tobacco. He reports that he does not drink alcohol and does not use drugs.  No Known Allergies  Family History  Problem Relation Age of Onset   Alzheimer's disease Mother    Colon cancer Father    Lung cancer Sister     Prior to Admission medications   Medication Sig Start Date End Date Taking? Authorizing Provider  acetaminophen (TYLENOL) 500 MG tablet Take 1,000 mg by mouth every 6 (six) hours as needed for moderate pain or headache.   Yes [provider]  aspirin EC 81 MG tablet Take 81 mg by mouth daily.   Yes [provider]  atorvastatin (LIPITOR) 40 MG tablet Take 40 mg by mouth daily.   Yes [provider]  buPROPion (WELLBUTRIN XL) 150 MG 24 hr tablet Take 150 mg by mouth daily. 10/17/17  Yes [provider]  Cyanocobalamin 1500 MCG TBDP Take 1,500 mcg by mouth daily. VITAMIN B-12   Yes [provider]  docusate sodium (COLACE) 100 MG capsule Take 1 tablet once or twice daily as needed for constipation while taking narcotic pain medicine Patient taking differently: Take 100 mg by mouth daily as needed  for mild constipation. 04/05/21  Yes Hinda Kehr, MD  DULoxetine (CYMBALTA) 60 MG capsule Take 60 mg by mouth daily.   Yes [provider]  latanoprost (XALATAN) 0.005 % ophthalmic solution Place 1 drop into both eyes at bedtime.   Yes [provider]  Multiple Vitamin (MULTIVITAMIN WITH MINERALS) TABS Take 1 tablet by mouth daily.   Yes [provider]  Omega-3 Fatty Acids (FISH OIL) 1200 MG CAPS Take 1,200 mg by mouth daily.   Yes [provider]  ondansetron (ZOFRAN ODT) 4 MG disintegrating tablet Allow 1-2 tablets to dissolve in your  mouth every 8 hours as needed for nausea/vomiting Patient taking differently: Take 4 mg by mouth every 8 (eight) hours as needed for nausea or vomiting. 04/05/21  Yes Hinda Kehr, MD  HYDROcodone-acetaminophen [facility]) 5-325 MG tablet 1-2 tabs po q6 hours prn pain Patient not taking: Reported on 02/04/2022 02/19/20   Leanora Cover, MD  methocarbamol (ROBAXIN) 500 MG tablet Take 1 tablet (500 mg total) by mouth every 6 (six) hours as needed for muscle spasms. Patient not taking: Reported on 02/04/2022 12/12/17   [place], [employer], PA-C  oxyCODONE-acetaminophen (PERCOCET) 5-325 MG tablet Take 2 tablets by mouth every 6 (six) hours as needed for severe pain. Patient not taking: Reported on 02/04/2022 04/05/21   Hinda Kehr, MD  tamsulosin [facility]) 0.4 MG CAPS capsule Take 1 tablet by mouth daily until you pass the kidney stone or no longer have symptoms Patient not taking: Reported on 02/04/2022 04/05/21   Hinda Kehr, MD    Physical Exam: Constitutional: Moderately built and nourished. Vitals:   02/03/22 2030 02/03/22 2115 02/03/22 2249 02/04/22 0050  BP: (!) 149/79 (!) 144/87 138/69 136/85  Pulse: 65 66 61 (!) 58  Resp: (!) 9 16 16 16   Temp:  98.4 F (36.9 C)    TempSrc:      SpO2: 99% 97% 96% 96%  Weight:       Eyes: Anicteric no pallor. ENMT: No discharge from the ears eyes nose and mouth. Neck: No mass felt.  No neck rigidity. Respiratory: No rhonchi or crepitations.   Cardiovascular: S1-S2 heard. Abdomen: Soft nontender bowel sound present. Musculoskeletal: No edema. Skin: No rash. Neurologic: Alert awake oriented time place and person.  Moving all extremities 5 x 5.  No facial asymmetry tongue is midline pupils are equal and reacting to light. Psychiatric: Appears normal.  Normal affect.   Labs on Admission: I have personally reviewed following labs and imaging studies  CBC: Recent Labs  Lab 02/03/22 1816  WBC 9.7  NEUTROABS 6.8  HGB 13.8  HCT 41.1  MCV 92.6  PLT 123456    Basic Metabolic Panel: Recent Labs  Lab 02/03/22 1816  NA 141  K 3.9  CL 104  CO2 28  GLUCOSE 91  BUN 12  CREATININE 0.74  CALCIUM 10.0   GFR: CrCl cannot be calculated (Unknown ideal weight.). Liver Function Tests: Recent Labs  Lab 02/03/22 1816  AST 22  ALT 24  ALKPHOS 65  BILITOT 0.9  PROT 7.2  ALBUMIN 4.2   No results for input(s): "LIPASE", "AMYLASE" in the last 168 hours. No results for input(s): "AMMONIA" in the last 168 hours. Coagulation Profile: Recent Labs  Lab 02/03/22 1816  INR 1.1   Cardiac Enzymes: No results for input(s): "CKTOTAL", "CKMB", "CKMBINDEX", "TROPONINI" in the last 168 hours. BNP (last 3 results) No results for input(s): "PROBNP" in the last 8760 hours. HbA1C: No results for input(s): "HGBA1C" in  the last 72 hours. CBG: Recent Labs  Lab 02/03/22 1906  GLUCAP 81   Lipid Profile: No results for input(s): "CHOL", "HDL", "LDLCALC", "TRIG", "CHOLHDL", "LDLDIRECT" in the last 72 hours. Thyroid Function Tests: No results for input(s): "TSH", "T4TOTAL", "FREET4", "T3FREE", "THYROIDAB" in the last 72 hours. Anemia Panel: No results for input(s): "VITAMINB12", "FOLATE", "FERRITIN", "TIBC", "IRON", "RETICCTPCT" in the last 72 hours. Urine analysis:    Component Value Date/Time   COLORURINE YELLOW 02/03/2022 2049   APPEARANCEUR CLEAR 02/03/2022 2049   LABSPEC 1.023 02/03/2022 2049   PHURINE 5.5 02/03/2022 2049   GLUCOSEU NEGATIVE 02/03/2022 2049   HGBUR TRACE (A) 02/03/2022 2049   BILIRUBINUR NEGATIVE 02/03/2022 2049   KETONESUR 15 (A) 02/03/2022 2049   PROTEINUR NEGATIVE 02/03/2022 2049   UROBILINOGEN 1.0 06/13/2011 1220   NITRITE NEGATIVE 02/03/2022 2049   LEUKOCYTESUR TRACE (A) 02/03/2022 2049   Sepsis Labs: @LABRCNTIP (procalcitonin:4,lacticidven:4) )No results found for this or any previous visit (from the past 240 hour(s)).   Radiological Exams on Admission: MR BRAIN WO CONTRAST  Result Date: 02/04/2022 CLINICAL DATA:   Acute neurologic deficit EXAM: MRI HEAD WITHOUT CONTRAST TECHNIQUE: Multiplanar, multiecho pulse sequences of the brain and surrounding structures were obtained without intravenous contrast. COMPARISON:  None Available. FINDINGS: Brain: Punctate focus of abnormal DWI signal intensity within the superior right frontal lobe. No clear ADC correlate. No other diffusion abnormality. Multiple chronic microhemorrhages in a predominantly central distribution. There is multifocal hyperintense T2-weighted signal within the white matter. Generalized cerebral volume loss. The midline structures are normal. Vascular: Major flow voids are preserved. Skull and upper cervical spine: Normal calvarium and skull base. Visualized upper cervical spine and soft tissues are normal. Sinuses/Orbits:No paranasal sinus fluid levels or advanced mucosal thickening. No mastoid or middle ear effusion. Normal orbits. IMPRESSION: 1. Punctate focus of subacute ischemia within the superior right frontal lobe. No hemorrhage or mass effect. 2. Multiple chronic microhemorrhages in a predominantly central distribution, consistent with chronic hypertensive angiopathy. Electronically Signed   By: Ulyses Jarred M.D.   On: 02/04/2022 02:38   CT HEAD WO CONTRAST  Result Date: 02/03/2022 CLINICAL DATA:  Dizziness. EXAM: CT HEAD WITHOUT CONTRAST TECHNIQUE: Contiguous axial images were obtained from the base of the skull through the vertex without intravenous contrast. RADIATION DOSE REDUCTION: This exam was performed according to the departmental dose-optimization program which includes automated exposure control, adjustment of the mA and/or kV according to patient size and/or use of iterative reconstruction technique. COMPARISON:  None Available. FINDINGS: Brain: There is no evidence of an acute cortically based infarct, intracranial hemorrhage, mass, midline shift, or extra-axial fluid collection. Patchy to confluent hypodensities are present in the  cerebral white matter bilaterally, and there is also symmetric, heterogeneous hypodensity throughout the basal ganglia bilaterally a more discrete 3 mm hypodensity in the left caudate head is compatible with a chronic lacunar infarct. The ventricles and sulci are within normal limits for age. Vascular: Calcified atherosclerosis at the skull base. No hyperdense vessel. Skull: No fracture or suspicious osseous lesion. Sinuses/Orbits: Trace fluid in the left sphenoid sinus. Clear mastoid air cells. Bilateral cataract extraction. Other: None. IMPRESSION: 1. No evidence of an acute cortically based infarct or intracranial hemorrhage. 2. Extensive hypodensity in the cerebral white matter and basal ganglia, nonspecific. While this may all reflect chronic small vessel ischemia, superimposed acute ischemia or a toxic/metabolic insult is not excluded. Consider MRI for further evaluation. Electronically Signed   By: Logan Bores M.D.   On: 02/03/2022 19:33  EKG: Independently reviewed.  Normal sinus rhythm.  Assessment/Plan Principal Problem:   Acute CVA (cerebrovascular accident) [facility]) Active Problems:   Hyperlipidemia   BPH (benign prostatic hyperplasia)    Acute CVA -appreciate neurology consult.  CT angiogram of the head and neck 2D echo lipid panel hemoglobin A1c has been ordered.  Patient is on aspirin Plavix statins.  Neurochecks.  Patient did pass stroke swallow. Recent tick bite.  No obvious rash on the body.  Denies any symptoms of joint pain or fever or chills.  We will continue to monitor we will check Lyme titers and RMSF titers. Hyperlipidemia on statins.   DVT prophylaxis: Lovenox. Code Status: Full code. Family Communication: Discussed with patient. Disposition Plan: Home. Consults called: Neurology. Admission status: Observation.   Rise Patience MD [facility] Pager [phone].  If 7PM-7AM, please contact night-coverage [email or web address] Password [email or web address]  02/04/2022,  5:29 AM

## 2022-02-04 NOTE — ED Provider Notes (Signed)
I assumed care of this patient.  Please see previous provider note for further details of Hx, PE.  Briefly patient is a 76 y.o. male who presented as a transfer from [facility] to rule out stroke.  MRI notable for right frontal stroke, subacute.   I spoke with Dr. Roda Shutters from neurology who recommended patient be admitted for stroke work-up.  Consulted hospitalist service and I spoke with Dr. Toniann Fail who agreed to admit patient.     Nira Conn, MD 02/04/22 [phone]

## 2022-02-04 NOTE — Consult Note (Signed)
Stroke Neurology Consultation Note  Consult Requested by: Dr. Bennye Alm  Reason for Consult: stroke  Consult Date: 02/04/22   The history was obtained from the pt.  During history and examination, all items were able to obtain unless otherwise noted.  History of Present Illness:  [name] is a 76 y.o. Caucasian male with PMH of HLD, BPH, anxiety, arthritis presented to ED for HA, dizziness, incoordination since Monday.  Patient stated that on Monday, he was working out in the yard and that he started to feel whole head hurts with lightheadedness, comes and goes.  For the next a couple of days, he also felt incoordination on walking and driving.  He had a near misses during driving for the last couple days.  Did not feel much weakness numbness but he did find mild slurred speech with difficulty pronouncing some words.  Denies any double vision, nausea vomiting.  CT head no acute abnormality.  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  Neurology was consulted for stroke.  He has hyperlipidemia, on Lipitor.  He also takes aspirin 81 daily.  Brother had cerebral aneurysm in the past.  He had right leg pain 2 to 3 weeks ago was put on prednisone at that time.  LSN: Monday tPA Given: No: Outside window  Past Medical History:  Diagnosis Date   Arthritis    mild lower back   BPH (benign prostatic hyperplasia)    BPH (benign prostatic hyperplasia)    Depression    Dyspnea    with exertion   HLD (hyperlipidemia)    Lumbar spinal stenosis    Pelvic injury 2015   both sides    Past Surgical History:  Procedure Laterality Date   CARPAL TUNNEL RELEASE Right 02/19/2020   Procedure: CARPAL TUNNEL RELEASE;  Surgeon: Betha Loa, MD;  Location: [facility];  Service: Orthopedics;  Laterality: Right;   colonscopy     every 5 yrs   JOINT REPLACEMENT  12-23-12   right hip replaced   left carpal tunnel surgery  yrs ago   LUMBAR LAMINECTOMY/DECOMPRESSION  MICRODISCECTOMY N/A 12/12/2017   Procedure: Decompressive lumbar laminectomy L4-L5 for stenosis;  Surgeon: Ranee Gosselin, MD;  Location: [facility];  Service: Orthopedics;  Laterality: N/A;   SHOULDER ARTHROSCOPY WITH ROTATOR CUFF REPAIR AND SUBACROMIAL DECOMPRESSION  12-23-12   Right/ left shoulder(microscopic surgery)   SHOULDER OPEN ROTATOR CUFF REPAIR Left 01/01/2013   Procedure: OPEN ANTERIOR ACROMINECTOMY/ROTATOR CUFF REPAIR/DISTAL CLAVICLE RESECTION  LEFT SHOULDER;  Surgeon: Drucilla Schmidt, MD;  Location: [facility];  Service: Orthopedics;  Laterality: Left;   TONSILLECTOMY     child    Family History  Problem Relation Age of Onset   Alzheimer's disease Mother    Colon cancer Father    Lung cancer Sister     Social History:  reports that he has never smoked. He has never used smokeless tobacco. He reports that he does not drink alcohol and does not use drugs.  Allergies: No Known Allergies  No current facility-administered medications on file prior to encounter.   Current Outpatient Medications on File Prior to Encounter  Medication Sig Dispense Refill   aspirin EC 81 MG tablet Take 81 mg by mouth at bedtime.     atorvastatin (LIPITOR) 40 MG tablet Take 40 mg by mouth at bedtime.      buPROPion (WELLBUTRIN XL) 150 MG 24 hr tablet Take 150 mg by mouth daily.  0   Cyanocobalamin 1500 MCG TBDP Take  1,500 mcg by mouth daily. VITAMIN B-12     docusate sodium (COLACE) 100 MG capsule Take 1 tablet once or twice daily as needed for constipation while taking narcotic pain medicine 30 capsule 0   DULoxetine (CYMBALTA) 60 MG capsule Take 60 mg by mouth at bedtime.      HYDROcodone-acetaminophen (NORCO) 5-325 MG tablet 1-2 tabs po q6 hours prn pain 20 tablet 0   latanoprost (XALATAN) 0.005 % ophthalmic solution Place 1 drop into both eyes at bedtime.     methocarbamol (ROBAXIN) 500 MG tablet Take 1 tablet (500 mg total) by mouth every 6 (six) hours as needed for muscle spasms. 40 tablet 0    Multiple Vitamin (MULTIVITAMIN WITH MINERALS) TABS Take 1 tablet by mouth daily.     Omega-3 Fatty Acids (FISH OIL) 1200 MG CAPS Take 1,200 mg by mouth at bedtime.     ondansetron (ZOFRAN ODT) 4 MG disintegrating tablet Allow 1-2 tablets to dissolve in your mouth every 8 hours as needed for nausea/vomiting 30 tablet 0   oxyCODONE-acetaminophen (PERCOCET) 5-325 MG tablet Take 2 tablets by mouth every 6 (six) hours as needed for severe pain. 16 tablet 0   tamsulosin (FLOMAX) 0.4 MG CAPS capsule Take 1 tablet by mouth daily until you pass the kidney stone or no longer have symptoms 14 capsule 0    Review of Systems: A full ROS was attempted today and was able to be performed.  Systems assessed include - Constitutional, Eyes, HENT, Respiratory, Cardiovascular, Gastrointestinal, Genitourinary, Integument/breast, Hematologic/lymphatic, Musculoskeletal, Neurological, Behavioral/Psych, Endocrine, Allergic/Immunologic - with pertinent responses as per HPI.  Physical Examination: Temp:  [97.5 F (36.4 C)-98.4 F (36.9 C)] 98.4 F (36.9 C) (06/09 2115) Pulse Rate:  [52-108] 58 (06/10 0050) Resp:  [9-16] 16 (06/10 0050) BP: (135-149)/(64-89) 136/85 (06/10 0050) SpO2:  [94 %-100 %] 96 % (06/10 0050) Weight:  [86.2 kg] 86.2 kg (06/09 1803)  General - well nourished, well developed, in no apparent distress.    Ophthalmologic - fundi not visualized due to noncooperation.    Cardiovascular - regular rhythm and rate  Mental Status -  Level of arousal and orientation to time, place, and person were intact. Language including expression, naming, repetition, comprehension, reading, and writing was assessed and found intact.  Mild dysarthria Fund of Knowledge was assessed and was intact.  Cranial Nerves II - XII - II - Vision intact OU. III, IV, VI - Extraocular movements intact. V - Facial sensation intact bilaterally. VII - slight left nasolabial fold flattening. VIII - Hearing & vestibular intact  bilaterally. X - Palate elevates symmetrically.  Mild dysarthria XI - Chin turning & shoulder shrug intact bilaterally. XII - Tongue protrusion intact.  Motor Strength - The patient's strength was normal in all extremities and pronator drift was absent.   Motor Tone & Bulk - Muscle tone was assessed at the neck and appendages and was normal.  Bulk was normal and fasciculations were absent.   Reflexes - The patient's reflexes were symmetrical in all extremities and he had no pathological reflexes.  Sensory - Light touch, temperature/pinprick were assessed and were symmetrical.    Coordination - The patient had dysmetria in the both hands and feet.  Tremor was absent.  Gait and Station - deferred  Data Reviewed: MR BRAIN WO CONTRAST  Result Date: 02/04/2022 CLINICAL DATA:  Acute neurologic deficit EXAM: MRI HEAD WITHOUT CONTRAST TECHNIQUE: Multiplanar, multiecho pulse sequences of the brain and surrounding structures were obtained without intravenous contrast. COMPARISON:  None Available. FINDINGS: Brain: Punctate focus of abnormal DWI signal intensity within the superior right frontal lobe. No clear ADC correlate. No other diffusion abnormality. Multiple chronic microhemorrhages in a predominantly central distribution. There is multifocal hyperintense T2-weighted signal within the white matter. Generalized cerebral volume loss. The midline structures are normal. Vascular: Major flow voids are preserved. Skull and upper cervical spine: Normal calvarium and skull base. Visualized upper cervical spine and soft tissues are normal. Sinuses/Orbits:No paranasal sinus fluid levels or advanced mucosal thickening. No mastoid or middle ear effusion. Normal orbits. IMPRESSION: 1. Punctate focus of subacute ischemia within the superior right frontal lobe. No hemorrhage or mass effect. 2. Multiple chronic microhemorrhages in a predominantly central distribution, consistent with chronic hypertensive angiopathy.  Electronically Signed   By: Deatra RobinsonKevin  Herman M.D.   On: 02/04/2022 02:38   CT HEAD WO CONTRAST  Result Date: 02/03/2022 CLINICAL DATA:  Dizziness. EXAM: CT HEAD WITHOUT CONTRAST TECHNIQUE: Contiguous axial images were obtained from the base of the skull through the vertex without intravenous contrast. RADIATION DOSE REDUCTION: This exam was performed according to the departmental dose-optimization program which includes automated exposure control, adjustment of the mA and/or kV according to patient size and/or use of iterative reconstruction technique. COMPARISON:  None Available. FINDINGS: Brain: There is no evidence of an acute cortically based infarct, intracranial hemorrhage, mass, midline shift, or extra-axial fluid collection. Patchy to confluent hypodensities are present in the cerebral white matter bilaterally, and there is also symmetric, heterogeneous hypodensity throughout the basal ganglia bilaterally a more discrete 3 mm hypodensity in the left caudate head is compatible with a chronic lacunar infarct. The ventricles and sulci are within normal limits for age. Vascular: Calcified atherosclerosis at the skull base. No hyperdense vessel. Skull: No fracture or suspicious osseous lesion. Sinuses/Orbits: Trace fluid in the left sphenoid sinus. Clear mastoid air cells. Bilateral cataract extraction. Other: None. IMPRESSION: 1. No evidence of an acute cortically based infarct or intracranial hemorrhage. 2. Extensive hypodensity in the cerebral white matter and basal ganglia, nonspecific. While this may all reflect chronic small vessel ischemia, superimposed acute ischemia or a toxic/metabolic insult is not excluded. Consider MRI for further evaluation. Electronically Signed   By: Sebastian AcheAllen  Grady M.D.   On: 02/03/2022 19:33    Assessment: 76 y.o. male with PMH of HLD, BPH, anxiety, arthritis presented to ED for HA, dizziness, incoordination, mild dysarthria since Monday. Exam showed mild dysarthria, slight left  nasolabial fold flattening, and dysmetria in all extremities. CT head no acute abnormality.  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  However, on MRI patient does have significant subcortical ischemic changes, and there are faint DWI signal at right pontine may indicate late subacute infarcts.  Recommend to admit for further stroke work-up, including CT head and neck, 2D echo. Given possible stroke in different vascular distribution, recommend cardiac monitoring to rule out afib also  Stroke Risk Factors - hyperlipidemia  Plan: Continue further stroke work up  Frequent neuro checks Telemetry monitoring CTA head and neck  Echocardiogram  UDS, fasting lipid panel and HgbA1C Consider cardiac monitoring as outpt to rule out afib PT/OT/speech consult Continue home aspirin 81, add Plavix for DAPT  Continue home statin for now  stroke risk factor modification Further evaluation as per inpatient neurology recommendations  Discussed with Dr. Eudelia Bunchardama ED physician Will follow   Thank you for this consultation and allowing us to participate in the care of this patient.  Marvel PlanJindong Desi Rowe, MD  PhD Stroke Neurology 02/04/2022 4:20 AM

## 2022-02-04 NOTE — ED Notes (Signed)
OT & PT at bedside with pt currently pt is ambulating in hallway with OT and PT

## 2022-02-04 NOTE — Care Management Obs Status (Signed)
MEDICARE OBSERVATION STATUS NOTIFICATION   Patient Details  Name: [name] MRN: [id number] Date of Birth: 1946/04/10   Medicare Observation Status Notification Given:  Yes    Lockie Pares, RN 02/04/2022, 4:29 PM

## 2022-02-04 NOTE — ED Notes (Signed)
Called floor reference no purple man assigned to pt at this time

## 2022-02-04 NOTE — Evaluation (Signed)
Physical Therapy Evaluation Patient Details Name: [name] MRN: [id number] DOB: [birth year] Today's Date: 02/04/2022  History of Present Illness  The pt is a 76 yo male presenting 6/9 with dizziness and incoordination since 6/5. MRI showed R frontal subacute infarct. PMH includes: HLD, BPH, anxiety, pelvic crush injury, and arthritis.   Clinical Impression  Pt in bed upon arrival of PT, agreeable to evaluation at this time. Prior to admission the pt was independent without use of DME for gait, enjoys working out in his yard, and reports independence with all ADLs, IADLs, and driving in community. The pt now presents with limitations in functional mobility, strength, coordination, stability, and safety awareness due to above dx, and will continue to benefit from skilled PT to address these deficits. The pt was able to rise to standing with minG and without UE support, but had multiple minor LOB with gait and at times running into objects in hallway on his L side causing him to stumble. The pt reports this is baseline for him, and is the reason he falls often at home. The pt demos grossly impaired safety awareness and insight to deficits. Will continue to benefit from skilled PT acutely and after d/c for continued balance training and gait training to reduce further risk of falls.         Recommendations for follow up therapy are one component of a multi-disciplinary discharge planning process, led by the attending physician.  Recommendations may be updated based on patient status, additional functional criteria and insurance authorization.  Follow Up Recommendations Home health PT    Assistance Recommended at Discharge Intermittent Supervision/Assistance  Patient can return home with the following  A little help with walking and/or transfers;A little help with bathing/dressing/bathroom;Assistance with cooking/housework;Direct supervision/assist for medications management;Direct  supervision/assist for financial management;Assist for transportation;Help with stairs or ramp for entrance    Equipment Recommendations None recommended by PT  Recommendations for Other Services       Functional Status Assessment Patient has had a recent decline in their functional status and demonstrates the ability to make significant improvements in function in a reasonable and predictable amount of time.     Precautions / Restrictions Precautions Precautions: Fall Precaution Comments: pt reports frequent falls Restrictions Weight Bearing Restrictions: No      Mobility  Bed Mobility Overal bed mobility: Modified Independent             General bed mobility comments: increased time and use of rails    Transfers Overall transfer level: Needs assistance Equipment used: None Transfers: Sit to/from Stand Sit to Stand: Min guard           General transfer comment: ming for safety    Ambulation/Gait Ambulation/Gait assistance: Min assist, Mod assist Gait Distance (Feet): 200 Feet Assistive device: None Gait Pattern/deviations: Step-through pattern, Decreased stride length, Drifts right/left, Narrow base of support Gait velocity: WFL     General Gait Details: pt with progressive RLE toe drag, ran into objects on L side causing him to trip, no change in speed after running into object, pt states he does this often which causes falls   Modified Rankin (Stroke Patients Only) Modified Rankin (Stroke Patients Only) Pre-Morbid Rankin Score: Slight disability Modified Rankin: Moderately severe disability     Balance Overall balance assessment: Needs assistance, History of Falls Sitting-balance support: No upper extremity supported Sitting balance-Leahy Scale: Good     Standing balance support: No upper extremity supported, During functional activity Standing balance-Leahy Scale: Fair  Standing balance comment: unable to tolerate challenge, but ambulating  without UE support                               Home Living Family/patient expects to be discharged to:: Private residence Living Arrangements: Alone Available Help at Discharge: Family;Friend(s);Available PRN/intermittently Type of Home: House Home Access: Ramped entrance     Alternate Level Stairs-Number of Steps: 4 stairs Home Layout: Multi-level;Able to live on main level with bedroom/bathroom Home Equipment: Cane - single point;Rolling Walker (2 wheels);Grab bars - tub/shower;Shower seat;Grab bars - toilet      Prior Function               Mobility Comments: Uses a cane sometimes       Hand Dominance   Dominant Hand: Right    Extremity/Trunk Assessment   Upper Extremity Assessment Upper Extremity Assessment: Defer to OT evaluation    Lower Extremity Assessment Lower Extremity Assessment: Overall WFL for tasks assessed (grossly 4+/5 to MMT other than L HS which was 4/5. pt with R foot drag with continued walking, still testing 4+/5 to MMT after gait)    Cervical / Trunk Assessment Cervical / Trunk Assessment:  (R lateral lean)  Communication   Communication: No difficulties  Cognition Arousal/Alertness: Awake/alert Behavior During Therapy: WFL for tasks assessed/performed Overall Cognitive Status: Impaired/Different from baseline Area of Impairment: Attention, Safety/judgement, Awareness, Problem solving                   Current Attention Level: Sustained     Safety/Judgement: Decreased awareness of safety, Decreased awareness of deficits Awareness: Intellectual Problem Solving: Decreased initiation, Requires verbal cues General Comments: pt with poor environmental awareness or ability to adjust to environment without cues. tripping when runnin into objects while walking        General Comments General comments (skin integrity, edema, etc.): VSS on RA        Assessment/Plan    PT Assessment Patient needs continued PT  services  PT Problem List Decreased strength;Decreased activity tolerance;Decreased balance;Decreased mobility;Decreased coordination;Decreased cognition;Decreased safety awareness       PT Treatment Interventions DME instruction;Gait training;Stair training;Functional mobility training;Therapeutic activities;Therapeutic exercise;Balance training;Patient/family education    PT Goals (Current goals can be found in the Care Plan section)  Acute Rehab PT Goals Patient Stated Goal: return to full independence, stay active PT Goal Formulation: With patient Time For Goal Achievement: 02/18/22 Potential to Achieve Goals: Good    Frequency Min 4X/week     Co-evaluation PT/OT/SLP Co-Evaluation/Treatment: Yes Reason for Co-Treatment: Necessary to address cognition/behavior during functional activity;To address functional/ADL transfers;For patient/therapist safety PT goals addressed during session: Mobility/safety with mobility;Balance         AM-PAC PT "6 Clicks" Mobility  Outcome Measure Help needed turning from your back to your side while in a flat bed without using bedrails?: A Little Help needed moving from lying on your back to sitting on the side of a flat bed without using bedrails?: A Little Help needed moving to and from a bed to a chair (including a wheelchair)?: A Little Help needed standing up from a chair using your arms (e.g., wheelchair or bedside chair)?: A Little Help needed to walk in hospital room?: A Little Help needed climbing 3-5 steps with a railing? : A Lot 6 Click Score: 17    End of Session Equipment Utilized During Treatment: Gait belt Activity Tolerance: Patient tolerated treatment well  Patient left: in bed;with call bell/phone within reach Nurse Communication: Mobility status PT Visit Diagnosis: Other abnormalities of gait and mobility (R26.89);Repeated falls (R29.6)    Time: [id number] PT Time Calculation (min) (ACUTE ONLY): 33 min   Charges:   PT  Evaluation $PT Eval Moderate Complexity: 1 Mod          West Carbo, PT, DPT   Acute Rehabilitation Department  Sandra Cockayne 02/04/2022, 3:46 PM
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
## 2022-02-04 NOTE — ED Notes (Signed)
CM at bedside

## 2022-02-04 NOTE — ED Notes (Signed)
US at bedside at this time 

## 2022-02-04 NOTE — ED Notes (Signed)
CM called and made aware at this time

## 2022-02-04 NOTE — ED Notes (Signed)
Patient returned from MRI.
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
## 2022-02-06 LAB — LYME DISEASE SEROLOGY W/REFLEX: Lyme Total Antibody EIA: NEGATIVE

## 2022-02-08 DIAGNOSIS — I6529 Occlusion and stenosis of unspecified carotid artery: Secondary | ICD-10-CM | POA: Diagnosis not present

## 2022-02-08 DIAGNOSIS — H6123 Impacted cerumen, bilateral: Secondary | ICD-10-CM | POA: Diagnosis not present

## 2022-02-08 DIAGNOSIS — Z0000 Encounter for general adult medical examination without abnormal findings: Secondary | ICD-10-CM | POA: Diagnosis not present

## 2022-02-08 DIAGNOSIS — Z09 Encounter for follow-up examination after completed treatment for conditions other than malignant neoplasm: Secondary | ICD-10-CM | POA: Diagnosis not present

## 2022-02-08 DIAGNOSIS — D6869 Other thrombophilia: Secondary | ICD-10-CM | POA: Diagnosis not present

## 2022-02-09 ENCOUNTER — Ambulatory Visit: Payer: Medicare Other | Admitting: Neurology

## 2022-02-09 ENCOUNTER — Encounter: Payer: Self-pay | Admitting: Neurology

## 2022-02-09 VITALS — BP 119/73 | HR 61 | Ht 70.0 in | Wt 198.0 lb

## 2022-02-09 DIAGNOSIS — R519 Headache, unspecified: Secondary | ICD-10-CM | POA: Diagnosis not present

## 2022-02-09 DIAGNOSIS — I639 Cerebral infarction, unspecified: Secondary | ICD-10-CM

## 2022-02-09 DIAGNOSIS — R4189 Other symptoms and signs involving cognitive functions and awareness: Secondary | ICD-10-CM | POA: Diagnosis not present

## 2022-02-09 DIAGNOSIS — R1310 Dysphagia, unspecified: Secondary | ICD-10-CM

## 2022-02-09 LAB — ROCKY MTN SPOTTED FVR ABS PNL(IGG+IGM)
RMSF IgG: POSITIVE — AB
RMSF IgM: 0.32 index (ref 0.00–0.89)

## 2022-02-09 LAB — RMSF, IGG, IFA: RMSF, IGG, IFA: <1:64 {titer}

## 2022-02-09 NOTE — Progress Notes (Addendum)
Chief Complaint  Patient presents with   New Patient (Initial Visit)    Rm 14. Accompanied by daughter, Lupita Leash. NP/internal ED referral for stroke, dizziness.      ASSESSMENT AND PLAN  [name] is a 76 y.o. male   Dementia  MoCA examination 18/30  Strong family history of dementia, mother, 2 sisters suffer dementia  He has vascular risk factor of aging, hyperlipidemia, no significant hypertensive history, that would explain his profound abnormal MRI of the brain, along with his increased headache, gait abnormality, slurred speech, will do more extensive laboratory evaluation to rule out underlying cause for his abnormal MRI of the brain,  Once he finished 3 weeks overlapping aspirin plus Plavix double antiplatelet treatment, should stay on Plavix 75 mg alone  Return to clinic in 3 months   DIAGNOSTIC DATA (LABS, IMAGING, TESTING) - I reviewed patient records, labs, notes, testing and imaging myself where available.   MEDICAL HISTORY:  [name], is a 76 year old male seen in request by his primary care physician Dr. Katrinka Blazing, Sonny Masters, for evaluation of memory loss, unsteady gait, slurred speech, initial evaluation was on February 09, 2022, he was alone at visit.  I reviewed and summarized the referring note.  Past medical history Hyperlipidemia  He is a retired Curator, lives alone, still drive, occasional headache in the past,  Over the past few months, he began to have frequent headaches, on June 5 he began to have more severe headache, take multiple doses of aspirin without helping his headaches, lasting for 4 to 5 days, on June 8, while he was driving to visit a shop on [address], he felt his coordination was off, has trouble staying on the road, increased headache, he has improved his side resting for a while  On February 03, 2022, he complains of worsening headache, dizziness while walking in the yard, presented to the emergency room leading to hospital  admission  Personally reviewed MRI of the brain without contrast February 04, 2022: Only punctuated acute DWI lesion at superior right frontal lobe, multiple chronic microhemorrhage, predominantly central distribution, suggestive of hypertensive angiopathy, confluent periventricular white matter disease,  Echocardiogram, normal ejection fraction, no regional wall motion abnormality  CT angiogram of head and neck showed no large vessel disease  Laboratory evaluations, A1c 5.1, LDL 64, hemoglobin of 13.1, creatinine of 0.71, Lyme titer was negative, [facility] spotted fever IgG was positive, IgM was negative, urinalysis showed no signs of UTI, INR 1.1,  Patient was noted to have action tremor, memory loss during today's examination, mild slurred speech, MoCA examination 18/30, significant short-term memory loss, also compromised visuospatial orientation  He has strong family history of memory loss, mother and 2 elderly sister suffer dementia  He lives alone, reported tends to lose things, also has been treated for anxiety, depression  He was on aspirin 81 mg prior to admission, was put on overlapping Plavix, and aspirin for 3 weeks, then aspirin alone,    PHYSICAL EXAM:   Vitals:   02/09/22 0803  BP: 119/73  Pulse: 61  Weight: 198 lb (89.8 kg)  Height: 5\' 10"  (1.778 m)    Body mass index is 28.41 kg/m.  PHYSICAL EXAMNIATION:  Gen: NAD, conversant, well nourised, well groomed                     Cardiovascular: Regular rate rhythm, no peripheral edema, warm, nontender. Eyes: Conjunctivae clear without exudates or hemorrhage Neck: Supple, no carotid bruits. Pulmonary:  Clear to auscultation bilaterally   NEUROLOGICAL EXAM:  MENTAL STATUS: Speech/cognition: mild slurred speech, slow reaction time,      02/09/2022    8:00 AM  Montreal Cognitive Assessment   Visuospatial/ Executive (0/5) 3  Naming (0/3) 3  Attention: Read list of digits (0/2) 2  Attention: Read list of  letters (0/1) 1  Attention: Serial 7 subtraction starting at 100 (0/3) 1  Language: Repeat phrase (0/2) 2  Language : Fluency (0/1) 0  Abstraction (0/2) 1  Delayed Recall (0/5) 0  Orientation (0/6) 5  Total 18  18/30, profound short-term memory loss,  CRANIAL NERVES: CN II: Visual fields are full to confrontation. Pupils are round equal and briskly reactive to light. CN III, IV, VI: extraocular movement are normal. No ptosis. CN V: Facial sensation is intact to light touch CN VII: Face is symmetric with normal eye closure  CN VIII: Hearing is normal to causal conversation. CN IX, X: Phonation is normal. CN XI: Head turning and shoulder shrug are intact  MOTOR: There is no pronator drift of out-stretched arms. Muscle bulk and tone are normal. Muscle strength is normal.  REFLEXES: Reflexes are 1  and symmetric at the biceps, triceps, knees, and ankles. Plantar responses are flexor.  SENSORY: Intact to light touch, pinprick and vibratory sensation are intact in fingers and toes.  COORDINATION: There is no trunk or limb dysmetria noted.  GAIT/STANCE: He needs push-up to get up from seated position, unsteady, mild truncal ataxia,  REVIEW OF SYSTEMS:  Full 14 system review of systems performed and notable only for as above All other review of systems were negative.   ALLERGIES: No Known Allergies  HOME MEDICATIONS: Current Outpatient Medications  Medication Sig Dispense Refill   acetaminophen (TYLENOL) 500 MG tablet Take 1,000 mg by mouth every 6 (six) hours as needed for moderate pain or headache.     aspirin EC 81 MG tablet Take 1 tablet (81 mg total) by mouth daily. 21 tablet 0   atorvastatin (LIPITOR) 40 MG tablet Take 40 mg by mouth daily.     buPROPion (WELLBUTRIN XL) 150 MG 24 hr tablet Take 150 mg by mouth daily.  0   clopidogrel (PLAVIX) 75 MG tablet Take 1 tablet (75 mg total) by mouth daily. 90 tablet 3   Cyanocobalamin 1500 MCG TBDP Take 1,000 mcg by mouth  daily. VITAMIN B-12     docusate sodium (COLACE) 100 MG capsule Take 1 tablet once or twice daily as needed for constipation while taking narcotic pain medicine (Patient taking differently: Take 100 mg by mouth daily as needed for mild constipation.) 30 capsule 0   DULoxetine (CYMBALTA) 60 MG capsule Take 60 mg by mouth daily.     latanoprost (XALATAN) 0.005 % ophthalmic solution Place 1 drop into both eyes at bedtime.     Multiple Vitamin (MULTIVITAMIN WITH MINERALS) TABS Take 1 tablet by mouth daily.     Omega-3 Fatty Acids (FISH OIL) 1200 MG CAPS Take 1,200 mg by mouth daily.     ondansetron (ZOFRAN ODT) 4 MG disintegrating tablet Allow 1-2 tablets to dissolve in your mouth every 8 hours as needed for nausea/vomiting (Patient taking differently: Take 4 mg by mouth every 8 (eight) hours as needed for nausea or vomiting.) 30 tablet 0   No current facility-administered medications for this visit.    PAST MEDICAL HISTORY: Past Medical History:  Diagnosis Date   Arthritis    mild lower back   BPH (benign prostatic hyperplasia)  BPH (benign prostatic hyperplasia)    Depression    Dyspnea    with exertion   HLD (hyperlipidemia)    Lumbar spinal stenosis    Pelvic injury 2015   both sides    PAST SURGICAL HISTORY: Past Surgical History:  Procedure Laterality Date   CARPAL TUNNEL RELEASE Right 02/19/2020   Procedure: CARPAL TUNNEL RELEASE;  Surgeon: Betha Loa, MD;  Location: [facility];  Service: Orthopedics;  Laterality: Right;   colonscopy     every 5 yrs   JOINT REPLACEMENT  12-23-12   right hip replaced   left carpal tunnel surgery  yrs ago   LUMBAR LAMINECTOMY/DECOMPRESSION MICRODISCECTOMY N/A 12/12/2017   Procedure: Decompressive lumbar laminectomy L4-L5 for stenosis;  Surgeon: Ranee Gosselin, MD;  Location: [facility];  Service: Orthopedics;  Laterality: N/A;   SHOULDER ARTHROSCOPY WITH ROTATOR CUFF REPAIR AND SUBACROMIAL DECOMPRESSION  12-23-12   Right/ left  shoulder(microscopic surgery)   SHOULDER OPEN ROTATOR CUFF REPAIR Left 01/01/2013   Procedure: OPEN ANTERIOR ACROMINECTOMY/ROTATOR CUFF REPAIR/DISTAL CLAVICLE RESECTION  LEFT SHOULDER;  Surgeon: Drucilla Schmidt, MD;  Location: [facility];  Service: Orthopedics;  Laterality: Left;   TONSILLECTOMY     child    FAMILY HISTORY: Family History  Problem Relation Age of Onset   Alzheimer's disease Mother    Colon cancer Father    Lung cancer Sister     SOCIAL HISTORY: Social History   Socioeconomic History   Marital status: Married    Spouse name: Not on file   Number of children: Not on file   Years of education: Not on file   Highest education level: Not on file  Occupational History   Not on file  Tobacco Use   Smoking status: Never   Smokeless tobacco: Never  Vaping Use   Vaping Use: Never used  Substance and Sexual Activity   Alcohol use: No   Drug use: No   Sexual activity: Yes  Other Topics Concern   Not on file  Social History Narrative   Not on file   Social Determinants of Health   Financial Resource Strain: Not on file  Food Insecurity: Not on file  Transportation Needs: Not on file  Physical Activity: Not on file  Stress: Not on file  Social Connections: Not on file  Intimate Partner Violence: Not on file      Levert Feinstein, M.D. Ph.D.  [facility] [address] [place], [place] [address] Ph: [phone] Fax: [phone]  CC:  Merri Brunette, MD [phone] [address] [place],  [place] [address]  Merri Brunette, MD

## 2022-02-09 NOTE — Addendum Note (Signed)
Addended by: Levert Feinstein on: 02/09/2022 09:03 AM   Modules accepted: Orders

## 2022-02-09 NOTE — Patient Instructions (Addendum)
Overlap asa 81mg  +plavix until June 25th. Then Stop ASA 81mg  daily,

## 2022-02-10 ENCOUNTER — Telehealth (HOSPITAL_COMMUNITY): Payer: Self-pay

## 2022-02-10 NOTE — Telephone Encounter (Signed)
Attempted to contact patient to schedule OP MBS - left voicemail. ?

## 2022-02-20 ENCOUNTER — Telehealth: Payer: Self-pay | Admitting: Neurology

## 2022-02-20 ENCOUNTER — Telehealth (HOSPITAL_COMMUNITY): Payer: Self-pay

## 2022-02-21 NOTE — Telephone Encounter (Signed)
I spoke with the patient's daughter (as per DPR). After a discussion with Dr. Terrace Arabia we discovered the lab req was printed and given to the patient to have labs drawn outside during the visit due to the Notch3 test. I informed patient's daughter that labs were printed and not drawn at our office.

## 2022-02-21 NOTE — Telephone Encounter (Signed)
Pt's daughter is asking for a call back to discuss the matter of where pt actually had blood work done, she can be reached at [phone]

## 2022-02-27 ENCOUNTER — Telehealth (HOSPITAL_COMMUNITY): Payer: Self-pay

## 2022-02-27 NOTE — Telephone Encounter (Signed)
2nd attempt to contact patient to schedule OP MBS - left voicemail. ?

## 2022-03-06 DIAGNOSIS — E785 Hyperlipidemia, unspecified: Secondary | ICD-10-CM | POA: Diagnosis not present

## 2022-03-06 DIAGNOSIS — R030 Elevated blood-pressure reading, without diagnosis of hypertension: Secondary | ICD-10-CM | POA: Diagnosis not present

## 2022-03-06 DIAGNOSIS — I491 Atrial premature depolarization: Secondary | ICD-10-CM | POA: Diagnosis not present

## 2022-03-06 DIAGNOSIS — Z8673 Personal history of transient ischemic attack (TIA), and cerebral infarction without residual deficits: Secondary | ICD-10-CM | POA: Diagnosis not present

## 2022-03-06 DIAGNOSIS — R519 Headache, unspecified: Secondary | ICD-10-CM | POA: Diagnosis not present

## 2022-03-06 DIAGNOSIS — G459 Transient cerebral ischemic attack, unspecified: Secondary | ICD-10-CM | POA: Diagnosis not present

## 2022-03-06 DIAGNOSIS — R42 Dizziness and giddiness: Secondary | ICD-10-CM | POA: Diagnosis not present

## 2022-03-06 DIAGNOSIS — E860 Dehydration: Secondary | ICD-10-CM | POA: Diagnosis not present

## 2022-03-13 LAB — NOTCH3 CADASIL SEQUENCING

## 2022-03-27 ENCOUNTER — Ambulatory Visit: Payer: Medicare Other | Admitting: Diagnostic Neuroimaging

## 2022-03-30 DIAGNOSIS — H11042 Peripheral pterygium, stationary, left eye: Secondary | ICD-10-CM | POA: Diagnosis not present

## 2022-03-30 DIAGNOSIS — H401133 Primary open-angle glaucoma, bilateral, severe stage: Secondary | ICD-10-CM | POA: Diagnosis not present

## 2022-03-30 DIAGNOSIS — H26491 Other secondary cataract, right eye: Secondary | ICD-10-CM | POA: Diagnosis not present

## 2022-03-30 DIAGNOSIS — Z961 Presence of intraocular lens: Secondary | ICD-10-CM | POA: Diagnosis not present

## 2022-04-21 ENCOUNTER — Ambulatory Visit (HOSPITAL_COMMUNITY)
Admission: AD | Admit: 2022-04-21 | Discharge: 2022-04-21 | Disposition: A | Discharge status: Home / Self Care | Payer: Medicare Other | Attending: Psychiatry | Admitting: Psychiatry

## 2022-04-21 NOTE — H&P (Signed)
Behavioral Health Medical Screening Exam  [name] is an 76 y.o. male with past history of depression who presented with his son [name]) for an assessment for "emotional changes". Patient reports increased emotional outbursts/responses that are out of character including dog barking.   Patient's wife passed away 11-19-2022and he currently lives alone. Recently at family members wedding and became emotional/tearful requiring family to console him. Reports being "easily upset over little things"; says similar instance happened when he was 76 years old and was admitted to [facility] for depression after being laid off from job and wife accumulating debt. Says he   He endorses "good sleep", some interest in hobbies but physically unable to participate, some feelings of guilt regarding grandson's mental illness, denies any feelings of worthlessness, "up and down" energy, okay concentration and appetite, fleeting thoughts of people he's known to complete suicide but denies any thoughts of hurting himself. He is able to complete serial 2's, 7's, 3 word recall, and able to interpret parables. He is alert and oriented to person, place, situation. Admits some memory and physical changes. Reports stroke about 2.5 months ago; minimal residuals. He denies any suicidal or homicidal ideations, auditory or visual hallucinations. Son reports availability at his home to support patient when needed; denies any safety concerns otherwise. Assists patient with bill payments and appointment management. Feels patient could benefit from therapy and possible medication change from current prescriber.  Provider discussed at length with patient aging and grief process including possible resources to address concerns. Both patient and son deny any interest in inpatient hospitalization and are able to contract for patient's safety. Deny any concerns for imminent safety but seeking resources to address patient mood and cognition.  Patient has support in neighbors and friend who is also a widower that lives nearby whom he visits frequently. Expressed interest in outpatient therapy and [facility].   Total Time spent with patient: 45 minutes  Psychiatric Specialty Exam: Physical Exam Vitals and nursing note reviewed.  Constitutional:      Appearance: He is normal weight. He is not ill-appearing or toxic-appearing.  HENT:     Head: Normocephalic.     Nose: Nose normal.     Mouth/Throat:     Mouth: Mucous membranes are moist.     Pharynx: Oropharynx is clear.  Eyes:     Pupils: Pupils are equal, round, and reactive to light.  Cardiovascular:     Rate and Rhythm: Normal rate.     Pulses: Normal pulses.  Pulmonary:     Effort: Pulmonary effort is normal.  Abdominal:     Palpations: Abdomen is soft.  Musculoskeletal:        General: Normal range of motion.     Cervical back: Normal range of motion.  Skin:    General: Skin is warm and dry.  Neurological:     Mental Status: He is alert.     Comments: Oriented to person, place, situation; slow to time  Psychiatric:        Attention and Perception: Attention and perception normal.        Mood and Affect: Mood is depressed. Affect is tearful.        Speech: Speech normal.        Behavior: Behavior is cooperative.        Thought Content: Thought content is not paranoid or delusional. Thought content does not include homicidal or suicidal ideation. Thought content does not include homicidal or suicidal plan.  Cognition and Memory: He exhibits impaired recent memory.        Judgment: Judgment normal.    Review of Systems  Constitutional:  Positive for activity change.  Psychiatric/Behavioral:  Positive for dysphoric mood. Negative for self-injury and suicidal ideas.    Blood pressure 133/77, pulse 79, temperature 98 F (36.7 C), temperature source Oral, resp. rate 18.There is no height or weight on file to calculate BMI. General Appearance:  Casual Eye Contact:  Good Speech:  Clear and Coherent Volume:  Normal Mood:  Depressed Affect:  Tearful Thought Process:  Coherent Orientation:  Full (Time, Place, and Person) Thought Content:  Logical Suicidal Thoughts:  No Homicidal Thoughts:  No Memory:  Immediate;   Fair Recent;   Fair Remote;   Fair Judgement:  Fair Insight:  Good and Present Psychomotor Activity:  Normal Concentration: Concentration: Fair and Attention Span: Fair Recall:  [employer] of Knowledge:Fair Language: Fair Akathisia:  NA Handed:  Right AIMS (if indicated):    Assets:  Communication Skills Desire for Improvement Financial Resources/Insurance Housing Physical Health Resilience Social Support Talents/Skills Transportation Sleep:     Musculoskeletal: Strength & Muscle Tone: within normal limits Gait & Station: normal Patient leans: N/A  Blood pressure 133/77, pulse 79, temperature 98 F (36.7 C), temperature source Oral, resp. rate 18.  [place] Scale:  Flowsheet Row ED from 02/03/2022 in [facility] EMERGENCY [facility] ED from 04/05/2021 in [facility]  C-SSRS RISK CATEGORY No Risk No Risk       Recommendations: Based on my evaluation the patient does not appear to have an emergency medical condition. Patient given information to [facility], outpatient therapists in the area, [facility] resources for individual follow-up.   Loletta Parish, NP 04/21/2022, 2:54 PM

## 2022-05-22 DIAGNOSIS — R519 Headache, unspecified: Secondary | ICD-10-CM | POA: Diagnosis not present

## 2022-05-24 ENCOUNTER — Ambulatory Visit: Payer: Medicare Other | Admitting: Neurology

## 2022-05-24 ENCOUNTER — Encounter: Payer: Self-pay | Admitting: Neurology

## 2022-05-24 VITALS — BP 133/74 | HR 67 | Ht 70.0 in | Wt 190.0 lb

## 2022-05-24 DIAGNOSIS — I679 Cerebrovascular disease, unspecified: Secondary | ICD-10-CM | POA: Diagnosis not present

## 2022-05-24 DIAGNOSIS — F0393 Unspecified dementia, unspecified severity, with mood disturbance: Secondary | ICD-10-CM | POA: Insufficient documentation

## 2022-05-24 DIAGNOSIS — G8929 Other chronic pain: Secondary | ICD-10-CM

## 2022-05-24 DIAGNOSIS — R519 Headache, unspecified: Secondary | ICD-10-CM

## 2022-05-24 NOTE — Patient Instructions (Signed)
Stop Aspirin  Keep Plavix=Clopidogrel 75mg  daily.  Keep follow up with Dr. Tamala Julian,   Only return to neurology as needed.

## 2022-05-24 NOTE — Progress Notes (Signed)
Chief Complaint  Patient presents with   New Patient (Initial Visit)    Rm 15. Alone. NX  [facility] 2023/internal [place] referral for memory issues, behavioral outbursts. [place] 18/30 on 02/09/22.      ASSESSMENT AND PLAN  DIN BOOKWALTER is a 76 y.o. 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  Discussed with patient and his son, decided not to proceed with Namenda and Aricept treatment,  Denied history of coronary artery disease, stop aspirin 81 mg daily, Plavix 75 mg daily  Return to clinic for new issues    DIAGNOSTIC DATA (LABS, IMAGING, TESTING) - I reviewed patient records, labs, notes, testing and imaging myself where available.   MEDICAL HISTORY:  JENTRY WARNELL, is a 76 year old male seen in request by his primary care physician Dr. Tamala Julian, [place], for evaluation of memory loss, unsteady gait, slurred speech, initial evaluation was on February 09, 2022, he was alone at visit.   I reviewed and summarized the referring note.  Past medical history Hyperlipidemia   He is a retired Dealer, lives alone, still drive, occasional headache in the past,   Over the past few months, he began to have frequent headaches, on June 5 he began to have more severe headache, take multiple doses of aspirin without helping his headaches,  lasting for 4 to 5 days, on June 8, while he was driving to visit a shop on [address], he felt his coordination was off, has trouble staying on the road, increased headache, he has improved his side resting for a while   On February 03, 2022, he complains of worsening headache, dizziness while walking in the yard, presented to the emergency room leading to hospital admission   Personally reviewed MRI of the brain without contrast February 04, 2022: Only punctuated acute DWI lesion at superior right frontal lobe, multiple chronic microhemorrhage, predominantly central distribution, suggestive of hypertensive angiopathy, confluent periventricular white matter disease,   Echocardiogram, normal ejection fraction, no regional wall motion abnormality   CT angiogram of head and neck showed no large vessel disease   Laboratory evaluations, A1c 5.1, LDL 64, hemoglobin of 13.1, creatinine of 0.71, Lyme titer was negative, [facility] spotted fever IgG was positive, IgM was negative, urinalysis showed no signs of UTI, INR 1.1,   Patient was noted to have action tremor, memory loss during today's examination, mild slurred speech, MoCA examination 18/30, significant short-term memory loss, also compromised visuospatial orientation   He has strong family history of memory loss, mother and 2 elderly sister suffer dementia   He lives alone, reported tends to lose things, also has been treated for anxiety, depression   He was on aspirin 81 mg prior to admission, was put on overlapping Plavix, and aspirin for 3 weeks, then Plavix alone,   Update May 24, 2022 He drove himself to clinic today, denies any difficulty finding his way, I was able to talk  with his son [name] via phone call at today's visit, He had 3 adult children, son live close to him, 2 daughters are out of state, he also complains of excessive family stress, one of his daughter, and her children suffered mental illness,  Patient also complains of  long history of depression, has been treated with Wellbutrin 150 mg daily, complains of increased depression since his wife passed away on Jul 21, 2021, tearful at today's visit, recently Wellbutrin was increased to 300 mg daily, also on Cymbalta 60 mg daily  Instead of taking Plavix 75 mg alone, he remained on double antiplatelet treatment, deny a history of heart attack  He eats frozen food most of the time, still driving, 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  PHYSICAL EXAMNIATION:  Gen: NAD, conversant, well nourised, well groomed                     Cardiovascular: Regular rate rhythm, no peripheral edema, warm, nontender. Eyes: Conjunctivae clear without exudates or hemorrhage Neck: Supple, no carotid bruits. Pulmonary: Clear to auscultation bilaterally   NEUROLOGICAL EXAM:  MENTAL STATUS: Speech/cognition: Mild slurred speech, emotional,     02/09/2022    8:00 AM  Montreal Cognitive Assessment   Visuospatial/ Executive (0/5) 3  Naming (0/3) 3  Attention: Read list of digits (0/2) 2  Attention: Read list of letters (0/1) 1  Attention: Serial 7 subtraction starting at 100 (0/3) 1  Language: Repeat phrase (0/2) 2  Language : Fluency (0/1) 0  Abstraction (0/2) 1  Delayed Recall (0/5) 0  Orientation (0/6) 5  Total 18    CRANIAL NERVES: CN II: Visual fields are full to confrontation. Pupils are round equal and briskly reactive to light. CN III, IV, VI: extraocular movement  are normal. No ptosis. CN V: Facial sensation is intact to light touch CN VII: Face is symmetric with normal eye closure  CN VIII: Hearing is normal to causal conversation. CN IX, X: Phonation is normal. CN XI: Head turning and shoulder shrug are intact CN XII: Mild slow spastic tongue movement  MOTOR: Mild action tremor, no weakness, no significant rigidity or bradykinesia  REFLEXES: Reflexes are 1 and symmetric at the biceps, triceps, knees, and ankles. Plantar responses are flexor.  SENSORY: Intact to light touch, pinprick and vibratory sensation are intact in fingers and toes.  COORDINATION: Mild trunk ataxia  GAIT/STANCE: Need push-up to get up from seated position, mildly ataxic unsteady gait  REVIEW OF SYSTEMS:  Full 14 system review of systems performed and notable only for as above All other review of systems were negative.   ALLERGIES: No Known Allergies  HOME MEDICATIONS: Current Outpatient Medications  Medication Sig Dispense Refill   acetaminophen (TYLENOL) 500 MG tablet Take 1,000 mg by mouth every 6 (six) hours as needed for moderate pain or headache.     aspirin EC 81 MG tablet Take 1 tablet (81 mg total) by mouth daily. 21 tablet 0   atorvastatin (  LIPITOR) 40 MG tablet Take 40 mg by mouth daily.     buPROPion (WELLBUTRIN XL) 300 MG 24 hr tablet Take 300 mg by mouth daily.  0   clopidogrel (PLAVIX) 75 MG tablet Take 1 tablet (75 mg total) by mouth daily. 90 tablet 3   Cyanocobalamin 1500 MCG TBDP Take 1,000 mcg by mouth daily. VITAMIN B-12     docusate sodium (COLACE) 100 MG capsule Take 1 tablet once or twice daily as needed for constipation while taking narcotic pain medicine (Patient taking differently: Take 100 mg by mouth daily as needed for mild constipation.) 30 capsule 0   DULoxetine (CYMBALTA) 60 MG capsule Take 60 mg by mouth daily.     latanoprost (XALATAN) 0.005 % ophthalmic solution Place 1 drop into both eyes at bedtime.     Multiple Vitamin  (MULTIVITAMIN WITH MINERALS) TABS Take 1 tablet by mouth daily.     Omega-3 Fatty Acids (FISH OIL) 1200 MG CAPS Take 1,200 mg by mouth daily.     No current facility-administered medications for this visit.    PAST MEDICAL HISTORY: Past Medical History:  Diagnosis Date   Arthritis    mild lower back   BPH (benign prostatic hyperplasia)    BPH (benign prostatic hyperplasia)    Depression    Dyspnea    with exertion   HLD (hyperlipidemia)    Lumbar spinal stenosis    Pelvic injury 2015   both sides    PAST SURGICAL HISTORY: Past Surgical History:  Procedure Laterality Date   CARPAL TUNNEL RELEASE Right 02/19/2020   Procedure: CARPAL TUNNEL RELEASE;  Surgeon: Leanora Cover, MD;  Location: [place];  Service: Orthopedics;  Laterality: Right;   colonscopy     every 5 yrs   JOINT REPLACEMENT  12-23-12   right hip replaced   left carpal tunnel surgery  yrs ago   LUMBAR LAMINECTOMY/DECOMPRESSION MICRODISCECTOMY N/A 12/12/2017   Procedure: Decompressive lumbar laminectomy L4-L5 for stenosis;  Surgeon: Latanya Maudlin, MD;  Location: [facility];  Service: Orthopedics;  Laterality: N/A;   SHOULDER ARTHROSCOPY WITH ROTATOR CUFF REPAIR AND SUBACROMIAL DECOMPRESSION  12-23-12   Right/ left shoulder(microscopic surgery)   SHOULDER OPEN ROTATOR CUFF REPAIR Left 01/01/2013   Procedure: OPEN ANTERIOR ACROMINECTOMY/ROTATOR CUFF REPAIR/DISTAL CLAVICLE RESECTION  LEFT SHOULDER;  Surgeon: Magnus Sinning, MD;  Location: [facility];  Service: Orthopedics;  Laterality: Left;   TONSILLECTOMY     child    FAMILY HISTORY: Family History  Problem Relation Age of Onset   Alzheimer's disease Mother    Colon cancer Father    Lung cancer Sister     SOCIAL HISTORY: Social History   Socioeconomic History   Marital status: Married    Spouse name: Not on file   Number of children: Not on file   Years of education: Not on file   Highest education level: Not on file  Occupational History    Not on file  Tobacco Use   Smoking status: Never   Smokeless tobacco: Never  Vaping Use   Vaping Use: Never used  Substance and Sexual Activity   Alcohol use: No   Drug use: No   Sexual activity: Yes  Other Topics Concern   Not on file  Social History Narrative   Not on file   Social Determinants of Health   Financial Resource Strain: Not on file  Food Insecurity: Not on file  Transportation Needs: Not on file  Physical Activity: Not on file  Stress: Not on file  Social Connections: Not on file  Intimate Partner Violence: Not on file      Marcial Pacas, M.D. Ph.D.  [facility] [address] [place], [place] [address] Ph: [phone] Fax: [phone]  CC:  Carol Ada, [place],  [place] [address]  Carol Ada, MD    Total time spent reviewing the chart, obtaining history, examined patient, ordering tests, documentation, consultations and family, care coordination was 45 minutes

## 2022-06-05 DIAGNOSIS — Z23 Encounter for immunization: Secondary | ICD-10-CM | POA: Diagnosis not present

## 2022-06-05 DIAGNOSIS — E785 Hyperlipidemia, unspecified: Secondary | ICD-10-CM | POA: Diagnosis not present

## 2022-06-05 DIAGNOSIS — Z8673 Personal history of transient ischemic attack (TIA), and cerebral infarction without residual deficits: Secondary | ICD-10-CM | POA: Diagnosis not present

## 2022-08-14 NOTE — Progress Notes (Unsigned)
No chief complaint on file.     ASSESSMENT AND PLAN  [name] is a 76 y.o. 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  Discussed with patient and his son, decided not to proceed with Namenda and Aricept treatment,  Continue Plavix and atorvastatin for secondary stroke prevention measures managed/monitored by PCP  Return to clinic for new issues    DIAGNOSTIC DATA (LABS, IMAGING, TESTING) - I reviewed patient records, labs, notes, testing and imaging myself where available.   MEDICAL HISTORY:  Update 08/15/2022 [name]: Patient returns for follow-up visit after prior visit with Dr. Krista Logan 3 months ago.  Overall stable without new stroke/TIA symptoms.        History provided for reference purposes only Update May 24, 2022 Dr. Krista Logan: He drove himself to clinic today, denies any difficulty finding his way, I was able to talk with his son [name] via phone call at today's visit, He had 3 adult children, son live close to him, 2 daughters are out of state, he also complains of excessive family stress, one of his daughter, and her children suffered mental illness,  Patient also complains of long history of depression, has been treated with Wellbutrin 150 mg daily, complains of increased depression since his wife passed  away on June 30, 2021, tearful at today's visit, recently Wellbutrin was increased to 300 mg daily, also on Cymbalta 60 mg daily  Instead of taking Plavix 75 mg alone, he remained on double antiplatelet treatment, deny a history of heart attack  He eats frozen food most of the time, still driving, 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 Krista Logan: [name], is a 76 year old male seen in request by his primary care physician Dr. Tamala Logan, [name], for evaluation of memory loss, unsteady gait, slurred speech, initial evaluation was on February 09, 2022, he was alone at visit.   I reviewed and summarized the referring note.  Past medical history Hyperlipidemia   He is a retired Dealer, lives alone, still drive, occasional headache in the past,   Over the past few months, he began to have frequent headaches, on June 5 he began to have more severe headache, take multiple doses of aspirin without helping his headaches, lasting for 4 to 5 days, on June 8, while he was driving to visit a shop on [address], he felt his coordination was off, has trouble staying on the road, increased headache, he has improved his side resting for a while   On February 03, 2022, he complains of worsening headache, dizziness while walking in the yard, presented to the emergency room leading to  hospital admission   Personally reviewed MRI of the brain without contrast February 04, 2022: Only punctuated acute DWI lesion at superior right frontal lobe, multiple chronic microhemorrhage, predominantly central distribution, suggestive of hypertensive angiopathy, confluent periventricular  white matter disease,   Echocardiogram, normal ejection fraction, no regional wall motion abnormality   CT angiogram of head and neck showed no large vessel disease   Laboratory evaluations, A1c 5.1, LDL 64, hemoglobin of 13.1, creatinine of 0.71, Lyme titer was negative, [facility] spotted fever IgG was positive, IgM was negative, urinalysis showed no signs of UTI, INR 1.1,   Patient was noted to have action tremor, memory loss during today's examination, mild slurred speech, MoCA examination 18/30, significant short-term memory loss, also compromised visuospatial orientation   He has strong family history of memory loss, mother and 2 elderly sister suffer dementia   He lives alone, reported tends to lose things, also has been treated for anxiety, depression   He was on aspirin 81 mg prior to admission, was put on overlapping Plavix, and aspirin for 3 weeks, then Plavix alone,       PHYSICAL EXAM:   There were no vitals filed for this visit.  Not recorded     There is no height or weight on file to calculate BMI.  PHYSICAL EXAMNIATION:  Gen: NAD, conversant, well nourised, well groomed                     Cardiovascular: Regular rate rhythm, no peripheral edema, warm, nontender. Eyes: Conjunctivae clear without exudates or hemorrhage Neck: Supple, no carotid bruits. Pulmonary: Clear to auscultation bilaterally   NEUROLOGICAL EXAM:  MENTAL STATUS: Speech/cognition: Mild slurred speech, emotional,     02/09/2022    8:00 AM  Montreal Cognitive Assessment   Visuospatial/ Executive (0/5) 3  Naming (0/3) 3  Attention: Read list of digits (0/2) 2  Attention: Read list of letters (0/1) 1  Attention: Serial 7 subtraction starting at 100 (0/3) 1  Language: Repeat phrase (0/2) 2  Language : Fluency (0/1) 0  Abstraction (0/2) 1  Delayed Recall (0/5) 0  Orientation (0/6) 5  Total 18    CRANIAL NERVES: CN II: Visual fields are full to confrontation. Pupils are round  equal and briskly reactive to light. CN III, IV, VI: extraocular movement are normal. No ptosis. CN V: Facial sensation is intact to light touch CN VII: Face is symmetric with normal eye closure  CN VIII: Hearing is normal to causal conversation. CN IX, X: Phonation is normal. CN XI: Head turning and shoulder shrug are intact CN XII: Mild slow spastic tongue movement  MOTOR: Mild action tremor, no weakness, no significant rigidity or bradykinesia  REFLEXES: Reflexes are 1 and symmetric at the biceps, triceps, knees, and ankles. Plantar responses are flexor.  SENSORY: Intact to light touch, pinprick and vibratory sensation are intact in fingers and toes.  COORDINATION: Mild trunk ataxia  GAIT/STANCE: Need push-up to get up from seated position, mildly ataxic unsteady gait  REVIEW OF SYSTEMS:  Full 14 system review of systems performed and notable only for as above All other review of systems were negative.   ALLERGIES: No Known Allergies  HOME MEDICATIONS: Current Outpatient Medications  Medication Sig Dispense Refill   acetaminophen (TYLENOL) 500 MG tablet Take 1,000 mg by mouth every 6 (six) hours as needed for moderate pain or headache.     atorvastatin (LIPITOR) 40 MG tablet Take 40 mg by mouth daily.  buPROPion (WELLBUTRIN XL) 300 MG 24 hr tablet Take 300 mg by mouth daily.  0   clopidogrel (PLAVIX) 75 MG tablet Take 1 tablet (75 mg total) by mouth daily. 90 tablet 3   Cyanocobalamin 1500 MCG TBDP Take 1,000 mcg by mouth daily. VITAMIN B-12     docusate sodium (COLACE) 100 MG capsule Take 1 tablet once or twice daily as needed for constipation while taking narcotic pain medicine (Patient taking differently: Take 100 mg by mouth daily as needed for mild constipation.) 30 capsule 0   DULoxetine (CYMBALTA) 60 MG capsule Take 60 mg by mouth daily.     latanoprost (XALATAN) 0.005 % ophthalmic solution Place 1 drop into both eyes at bedtime.     Multiple Vitamin  (MULTIVITAMIN WITH MINERALS) TABS Take 1 tablet by mouth daily.     Omega-3 Fatty Acids (FISH OIL) 1200 MG CAPS Take 1,200 mg by mouth daily.     No current facility-administered medications for this visit.    PAST MEDICAL HISTORY: Past Medical History:  Diagnosis Date   Arthritis    mild lower back   BPH (benign prostatic hyperplasia)    BPH (benign prostatic hyperplasia)    Depression    Dyspnea    with exertion   HLD (hyperlipidemia)    Lumbar spinal stenosis    Pelvic injury 2015   both sides    PAST SURGICAL HISTORY: Past Surgical History:  Procedure Laterality Date   CARPAL TUNNEL RELEASE Right 02/19/2020   Procedure: CARPAL TUNNEL RELEASE;  Surgeon: Leanora Cover, MD;  Location: [place];  Service: Orthopedics;  Laterality: Right;   colonscopy     every 5 yrs   JOINT REPLACEMENT  12-23-12   right hip replaced   left carpal tunnel surgery  yrs ago   LUMBAR LAMINECTOMY/DECOMPRESSION MICRODISCECTOMY N/A 12/12/2017   Procedure: Decompressive lumbar laminectomy L4-L5 for stenosis;  Surgeon: Latanya Maudlin, MD;  Location: [facility];  Service: Orthopedics;  Laterality: N/A;   SHOULDER ARTHROSCOPY WITH ROTATOR CUFF REPAIR AND SUBACROMIAL DECOMPRESSION  12-23-12   Right/ left shoulder(microscopic surgery)   SHOULDER OPEN ROTATOR CUFF REPAIR Left 01/01/2013   Procedure: OPEN ANTERIOR ACROMINECTOMY/ROTATOR CUFF REPAIR/DISTAL CLAVICLE RESECTION  LEFT SHOULDER;  Surgeon: Magnus Sinning, MD;  Location: [facility];  Service: Orthopedics;  Laterality: Left;   TONSILLECTOMY     child    FAMILY HISTORY: Family History  Problem Relation Age of Onset   Alzheimer's disease Mother    Colon cancer Father    Lung cancer Sister     SOCIAL HISTORY: Social History   Socioeconomic History   Marital status: Married    Spouse name: Not on file   Number of children: Not on file   Years of education: Not on file   Highest education level: Not on file  Occupational History    Not on file  Tobacco Use   Smoking status: Never   Smokeless tobacco: Never  Vaping Use   Vaping Use: Never used  Substance and Sexual Activity   Alcohol use: No   Drug use: No   Sexual activity: Yes  Other Topics Concern   Not on file  Social History Narrative   Not on file   Social Determinants of Health   Financial Resource Strain: Not on file  Food Insecurity: Not on file  Transportation Needs: Not on file  Physical Activity: Not on file  Stress: Not on file  Social Connections: Not on file  Intimate Partner Violence:  Not on file      I spent *** minutes of face-to-face and non-face-to-face time with patient.  This included previsit chart review, lab review, study review, order entry, electronic health record documentation, patient education and discussion regarding above diagnoses and treatment plan and answered all other questions to patients satisfaction   Frann Rider, [facility]  [facility] [address] [place], [place] [address]  Phone [phone] Fax [phone] Note: This document was prepared with digital dictation and possible smart phrase technology. Any transcriptional errors that result from this process are unintentional.

## 2022-08-15 ENCOUNTER — Ambulatory Visit: Payer: Medicare Other | Admitting: Adult Health

## 2022-08-15 ENCOUNTER — Encounter: Payer: Self-pay | Admitting: Adult Health

## 2022-08-15 VITALS — BP 134/66 | HR 53 | Ht 71.0 in | Wt 190.0 lb

## 2022-08-15 DIAGNOSIS — I679 Cerebrovascular disease, unspecified: Secondary | ICD-10-CM

## 2022-08-15 DIAGNOSIS — R4189 Other symptoms and signs involving cognitive functions and awareness: Secondary | ICD-10-CM

## 2022-08-15 NOTE — Patient Instructions (Signed)
Your recent memory test looked good! It improved from 18/30 to 23/30 today!   Continue clopidogrel 75 mg daily  and atorvastatin  for secondary stroke prevention  Continue to follow up with PCP regarding cholesterol and blood pressure management  Maintain strict control of hypertension with blood pressure goal below 130/90 and cholesterol with LDL cholesterol (bad cholesterol) goal below 70 mg/dL.   Signs of a Stroke? Follow the BEFAST method:  Balance Watch for a sudden loss of balance, trouble with coordination or vertigo Eyes Is there a sudden loss of vision in one or both eyes? Or double vision?  Face: Ask the person to smile. Does one side of the face droop or is it numb?  Arms: Ask the person to raise both arms. Does one arm drift downward? Is there weakness or numbness of a leg? Speech: Ask the person to repeat a simple phrase. Does the speech sound slurred/strange? Is the person confused ? Time: If you observe any of these signs, call 911.       Thank you for coming to see [place] at [facility]. I hope we have been able to provide you high quality care today.  You may receive a patient satisfaction survey over the next few weeks. We would appreciate your feedback and comments so that we may continue to improve ourselves and the health of our patients.

## 2023-01-04 DIAGNOSIS — H401133 Primary open-angle glaucoma, bilateral, severe stage: Secondary | ICD-10-CM | POA: Diagnosis not present

## 2023-01-24 DIAGNOSIS — I679 Cerebrovascular disease, unspecified: Secondary | ICD-10-CM | POA: Diagnosis not present

## 2023-01-24 DIAGNOSIS — E785 Hyperlipidemia, unspecified: Secondary | ICD-10-CM | POA: Diagnosis not present

## 2023-01-24 DIAGNOSIS — I6529 Occlusion and stenosis of unspecified carotid artery: Secondary | ICD-10-CM | POA: Diagnosis not present

## 2023-01-24 DIAGNOSIS — I700 Atherosclerosis of aorta: Secondary | ICD-10-CM | POA: Diagnosis not present

## 2023-01-24 DIAGNOSIS — Z0000 Encounter for general adult medical examination without abnormal findings: Secondary | ICD-10-CM | POA: Diagnosis not present

## 2023-01-24 DIAGNOSIS — D6859 Other primary thrombophilia: Secondary | ICD-10-CM | POA: Diagnosis not present

## 2023-01-24 DIAGNOSIS — Z9181 History of falling: Secondary | ICD-10-CM | POA: Diagnosis not present

## 2023-02-09 DIAGNOSIS — D72829 Elevated white blood cell count, unspecified: Secondary | ICD-10-CM | POA: Diagnosis not present

## 2023-03-05 DIAGNOSIS — J029 Acute pharyngitis, unspecified: Secondary | ICD-10-CM | POA: Diagnosis not present

## 2023-03-05 DIAGNOSIS — U071 COVID-19: Secondary | ICD-10-CM | POA: Diagnosis not present

## 2023-03-13 DIAGNOSIS — Z8616 Personal history of COVID-19: Secondary | ICD-10-CM | POA: Diagnosis not present

## 2023-03-13 DIAGNOSIS — K921 Melena: Secondary | ICD-10-CM | POA: Diagnosis not present

## 2023-04-02 ENCOUNTER — Telehealth: Payer: Self-pay | Admitting: Neurology

## 2023-04-02 MED ORDER — CLOPIDOGREL BISULFATE 75 MG PO TABS: 75.0000 mg | ORAL_TABLET | Freq: Every day | ORAL | 3 refills | Status: AC

## 2023-04-02 NOTE — Telephone Encounter (Signed)
[name] went to see his primary care and is in need of refills on his Plavix. His PCP does not prescribe that, that comes from you. Can we get a refill on this for him, please?   Meds ordered this encounter  Medications   clopidogrel (PLAVIX) 75 MG tablet    Sig: Take 1 tablet (75 mg total) by mouth daily.    Dispense:  90 tablet    Refill:  3     I refilled his Plavix 1 year supply,  He has no follow-up scheduled with our clinic, please discussed with patient and family, he may continue his Plavix refill by his  PCP Merri Brunette, MD

## 2023-06-21 DIAGNOSIS — M19041 Primary osteoarthritis, right hand: Secondary | ICD-10-CM | POA: Diagnosis not present

## 2023-06-21 DIAGNOSIS — M79641 Pain in right hand: Secondary | ICD-10-CM | POA: Diagnosis not present

## 2023-06-25 DIAGNOSIS — H6122 Impacted cerumen, left ear: Secondary | ICD-10-CM | POA: Diagnosis not present

## 2023-07-17 DIAGNOSIS — H26491 Other secondary cataract, right eye: Secondary | ICD-10-CM | POA: Diagnosis not present

## 2023-07-17 DIAGNOSIS — H11042 Peripheral pterygium, stationary, left eye: Secondary | ICD-10-CM | POA: Diagnosis not present

## 2023-07-17 DIAGNOSIS — Z961 Presence of intraocular lens: Secondary | ICD-10-CM | POA: Diagnosis not present

## 2023-07-17 DIAGNOSIS — H401133 Primary open-angle glaucoma, bilateral, severe stage: Secondary | ICD-10-CM | POA: Diagnosis not present

## 2023-07-23 DIAGNOSIS — G300 Alzheimer's disease with early onset: Secondary | ICD-10-CM | POA: Diagnosis not present

## 2023-07-23 DIAGNOSIS — I700 Atherosclerosis of aorta: Secondary | ICD-10-CM | POA: Diagnosis not present

## 2023-07-23 DIAGNOSIS — I679 Cerebrovascular disease, unspecified: Secondary | ICD-10-CM | POA: Diagnosis not present

## 2023-07-23 DIAGNOSIS — E785 Hyperlipidemia, unspecified: Secondary | ICD-10-CM | POA: Diagnosis not present

## 2023-07-23 DIAGNOSIS — D6869 Other thrombophilia: Secondary | ICD-10-CM | POA: Diagnosis not present

## 2023-11-14 DIAGNOSIS — H11042 Peripheral pterygium, stationary, left eye: Secondary | ICD-10-CM | POA: Diagnosis not present

## 2023-11-14 DIAGNOSIS — H26491 Other secondary cataract, right eye: Secondary | ICD-10-CM | POA: Diagnosis not present

## 2023-11-14 DIAGNOSIS — Z961 Presence of intraocular lens: Secondary | ICD-10-CM | POA: Diagnosis not present

## 2023-11-14 DIAGNOSIS — H401133 Primary open-angle glaucoma, bilateral, severe stage: Secondary | ICD-10-CM | POA: Diagnosis not present

## 2024-01-16 DIAGNOSIS — M19041 Primary osteoarthritis, right hand: Secondary | ICD-10-CM | POA: Diagnosis not present

## 2024-02-14 DIAGNOSIS — M79651 Pain in right thigh: Secondary | ICD-10-CM | POA: Diagnosis not present

## 2024-02-14 DIAGNOSIS — Z23 Encounter for immunization: Secondary | ICD-10-CM | POA: Diagnosis not present

## 2024-02-14 DIAGNOSIS — Z0000 Encounter for general adult medical examination without abnormal findings: Secondary | ICD-10-CM | POA: Diagnosis not present

## 2024-02-14 DIAGNOSIS — I679 Cerebrovascular disease, unspecified: Secondary | ICD-10-CM | POA: Diagnosis not present

## 2024-02-14 DIAGNOSIS — I6529 Occlusion and stenosis of unspecified carotid artery: Secondary | ICD-10-CM | POA: Diagnosis not present

## 2024-02-14 DIAGNOSIS — E785 Hyperlipidemia, unspecified: Secondary | ICD-10-CM | POA: Diagnosis not present

## 2024-02-14 DIAGNOSIS — R269 Unspecified abnormalities of gait and mobility: Secondary | ICD-10-CM | POA: Diagnosis not present

## 2024-02-14 DIAGNOSIS — I700 Atherosclerosis of aorta: Secondary | ICD-10-CM | POA: Diagnosis not present

## 2024-02-26 DIAGNOSIS — M19041 Primary osteoarthritis, right hand: Secondary | ICD-10-CM | POA: Diagnosis not present

## 2024-02-26 DIAGNOSIS — M79641 Pain in right hand: Secondary | ICD-10-CM | POA: Diagnosis not present

## 2024-03-27 DIAGNOSIS — G300 Alzheimer's disease with early onset: Secondary | ICD-10-CM | POA: Diagnosis not present

## 2024-03-27 DIAGNOSIS — E785 Hyperlipidemia, unspecified: Secondary | ICD-10-CM | POA: Diagnosis not present

## 2024-04-17 DIAGNOSIS — S0502XA Injury of conjunctiva and corneal abrasion without foreign body, left eye, initial encounter: Secondary | ICD-10-CM | POA: Diagnosis not present

## 2024-04-17 DIAGNOSIS — S0501XA Injury of conjunctiva and corneal abrasion without foreign body, right eye, initial encounter: Secondary | ICD-10-CM | POA: Diagnosis not present

## 2024-04-17 DIAGNOSIS — H1133 Conjunctival hemorrhage, bilateral: Secondary | ICD-10-CM | POA: Diagnosis not present

## 2024-04-27 DIAGNOSIS — G300 Alzheimer's disease with early onset: Secondary | ICD-10-CM | POA: Diagnosis not present

## 2024-04-27 DIAGNOSIS — E785 Hyperlipidemia, unspecified: Secondary | ICD-10-CM | POA: Diagnosis not present

## 2024-05-02 DIAGNOSIS — M898X5 Other specified disorders of bone, thigh: Secondary | ICD-10-CM | POA: Diagnosis not present

## 2024-05-02 DIAGNOSIS — M25551 Pain in right hip: Secondary | ICD-10-CM | POA: Diagnosis not present

## 2024-05-05 DIAGNOSIS — H26491 Other secondary cataract, right eye: Secondary | ICD-10-CM | POA: Diagnosis not present

## 2024-05-05 DIAGNOSIS — Z961 Presence of intraocular lens: Secondary | ICD-10-CM | POA: Diagnosis not present

## 2024-05-05 DIAGNOSIS — H11042 Peripheral pterygium, stationary, left eye: Secondary | ICD-10-CM | POA: Diagnosis not present

## 2024-05-05 DIAGNOSIS — H43812 Vitreous degeneration, left eye: Secondary | ICD-10-CM | POA: Diagnosis not present

## 2024-05-05 DIAGNOSIS — H401133 Primary open-angle glaucoma, bilateral, severe stage: Secondary | ICD-10-CM | POA: Diagnosis not present

## 2024-05-06 ENCOUNTER — Encounter (INDEPENDENT_AMBULATORY_CARE_PROVIDER_SITE_OTHER): Admitting: Ophthalmology

## 2024-05-06 DIAGNOSIS — H4312 Vitreous hemorrhage, left eye: Secondary | ICD-10-CM

## 2024-05-06 DIAGNOSIS — H43813 Vitreous degeneration, bilateral: Secondary | ICD-10-CM

## 2024-05-19 DIAGNOSIS — R3914 Feeling of incomplete bladder emptying: Secondary | ICD-10-CM | POA: Diagnosis not present

## 2024-05-19 DIAGNOSIS — R3912 Poor urinary stream: Secondary | ICD-10-CM | POA: Diagnosis not present

## 2024-05-27 DIAGNOSIS — G300 Alzheimer's disease with early onset: Secondary | ICD-10-CM | POA: Diagnosis not present

## 2024-05-27 DIAGNOSIS — E785 Hyperlipidemia, unspecified: Secondary | ICD-10-CM | POA: Diagnosis not present

## 2024-09-05 ENCOUNTER — Encounter (INDEPENDENT_AMBULATORY_CARE_PROVIDER_SITE_OTHER): Admitting: Ophthalmology

## 2024-09-06 ENCOUNTER — Encounter (HOSPITAL_BASED_OUTPATIENT_CLINIC_OR_DEPARTMENT_OTHER): Payer: Self-pay

## 2024-09-06 ENCOUNTER — Other Ambulatory Visit: Payer: Self-pay

## 2024-09-06 ENCOUNTER — Telehealth (HOSPITAL_COMMUNITY): Payer: Self-pay | Admitting: Radiology

## 2024-09-06 ENCOUNTER — Emergency Department (HOSPITAL_BASED_OUTPATIENT_CLINIC_OR_DEPARTMENT_OTHER)
Admission: EM | Admit: 2024-09-06 | Discharge: 2024-09-06 | Disposition: A | Discharge status: Home / Self Care | Attending: Emergency Medicine | Admitting: Emergency Medicine

## 2024-09-06 DIAGNOSIS — R32 Unspecified urinary incontinence: Secondary | ICD-10-CM | POA: Insufficient documentation

## 2024-09-06 DIAGNOSIS — F0390 Unspecified dementia without behavioral disturbance: Secondary | ICD-10-CM | POA: Insufficient documentation

## 2024-09-06 LAB — URINALYSIS, W/ REFLEX TO CULTURE (INFECTION SUSPECTED)
Bacteria, UA: NONE SEEN
Bilirubin Urine: NEGATIVE
Glucose, UA: NEGATIVE mg/dL
Hgb urine dipstick: NEGATIVE
Ketones, ur: NEGATIVE mg/dL
Leukocytes,Ua: NEGATIVE
Nitrite: NEGATIVE
Protein, ur: NEGATIVE mg/dL
Specific Gravity, Urine: 1.013 (ref 1.005–1.030)
pH: 7.5 (ref 5.0–8.0)

## 2024-09-06 NOTE — ED Provider Notes (Signed)
 " [place] EMERGENCY DEPARTMENT AT [facility] Provider Note   CSN: [id number] Arrival date & time: 09/06/24  1113     Patient presents with: Urinary Incontinence   [name] is a 79 y.o. male.  With a history of dementia and BPH who presents to the ED for urinary incontinence.  Per son at bedside, patient has been experiencing issues with urinary incontinence for last 1 to 2 months.  He underwent initial evaluation by [facility] who would plan further testing for evaluation of incontinence and BPH.  This was scheduled for January 8 but he did not undergo the testing as he developed viral symptoms prior to the scheduled bladder test.  During the last 2 to 3 days has had increased urinary frequency and difficulty voiding to completion with multiple episodes of urinary incontinence.  Denies dysuria abdominal pain fevers chills or other complaints at this time.  He feels as though viral symptoms are improving   HPI     Prior to Admission medications  Medication Sig Start Date End Date Taking? Authorizing Provider  acetaminophen  (TYLENOL ) 500 MG tablet Take 1,000 mg by mouth every 6 (six) hours as needed for moderate pain or headache.    [provider]  atorvastatin  (LIPITOR) 40 MG tablet Take 40 mg by mouth daily.    [provider]  buPROPion  (WELLBUTRIN  XL) 300 MG 24 hr tablet Take 300 mg by mouth daily. 10/17/17   [provider]  clopidogrel  (PLAVIX ) 75 MG tablet Take 1 tablet (75 mg total) by mouth daily. 04/02/23   Onita Duos, MD  Cyanocobalamin  1500 MCG TBDP Take 1,000 mcg by mouth daily. VITAMIN B-12    [provider]  docusate sodium  (COLACE) 100 MG capsule Take 1 tablet once or twice daily as needed for constipation while taking narcotic pain medicine Patient taking differently: Take 100 mg by mouth daily as needed for mild constipation. 04/05/21   Gordan Huxley, MD  DULoxetine  (CYMBALTA ) 60 MG capsule Take 60 mg by mouth daily.     [provider]  latanoprost  (XALATAN ) 0.005 % ophthalmic solution Place 1 drop into both eyes at bedtime.    [provider]  Multiple Vitamin (MULTIVITAMIN WITH MINERALS) TABS Take 1 tablet by mouth daily.    [provider]  Omega-3 Fatty Acids (FISH OIL) 1200 MG CAPS Take 1,200 mg by mouth daily.    [provider]    Allergies: Patient has no known allergies.    Review of Systems  Updated Vital Signs BP (!) 151/70 (BP Location: Right Arm)   Pulse 80   Temp 98.7 F (37.1 C)   Resp 18   Ht 5' 11 (1.803 m)   Wt 86.2 kg   SpO2 97%   BMI 26.50 kg/m   Physical Exam Vitals and nursing note reviewed.  HENT:     Head: Normocephalic and atraumatic.  Eyes:     Pupils: Pupils are equal, round, and reactive to light.  Cardiovascular:     Rate and Rhythm: Normal rate and regular rhythm.  Pulmonary:     Effort: Pulmonary effort is normal.     Breath sounds: Normal breath sounds.  Abdominal:     Palpations: Abdomen is soft.     Tenderness: There is no abdominal tenderness.  Skin:    General: Skin is warm and dry.  Neurological:     Mental Status: He is alert.  Psychiatric:        Mood and Affect:  Mood normal.     (all labs ordered are listed, but only abnormal results are displayed) Labs Reviewed  URINALYSIS, W/ REFLEX TO CULTURE (INFECTION SUSPECTED)    EKG: None  Radiology: No results found.   Procedures   Medications Ordered in the ED - No data to display  Clinical Course as of 09/06/24 1339  Sat Sep 06, 2024  1338 No evidence of UTI.  Patient will follow-up with his urology team for further evaluation of incontinence [MP]    Clinical Course User Index [MP] Pamella Ozell LABOR, DO                                 Medical Decision Making 79 year old male with history as above presenting for increasing urinary incontinence.  History of BPH and currently undergoing evaluation with urology for this issue.  Son feels as  though incontinence and increased urinary frequency are getting worse.  This is most likely progression of known BPH for which he has already established care with [facility] for but need to rule out UTI here today.  Will obtain UA and give antibiotics if indicated        Final diagnoses:  Urinary incontinence, unspecified type    ED Discharge Orders     None          Pamella Ozell LABOR, DO 09/06/24 1339  "

## 2024-09-06 NOTE — Discharge Instructions (Signed)
 You were seen in the emergency department for urinary incontinence There is no evidence of urinary tract infection Need to follow-up with your [facility] team to have the scheduled test done and discuss further management of your incontinence Return to the Emergency Department for fevers or other concerns

## 2024-09-06 NOTE — ED Triage Notes (Signed)
 Patient states ongoing bladder issues for several weeks. Seeing urology for same. This week began having new urinary incontinence.

## 2024-09-06 NOTE — Telephone Encounter (Signed)
 Call from pt's son, pt in ED at [facility]. Asking urology doc name, pt is supposed to see Dr. Watt there. [facility] Appt is scheduled for 10/01/24.
